# Patient Record
Sex: Female | Born: 1935 | Race: White | Hispanic: No | Marital: Married | Smoking: Never smoker
Health system: Southern US, Community
[De-identification: ages and names within clinical notes are randomized; demographics above are authoritative.]

## PROBLEM LIST (undated history)

## (undated) DIAGNOSIS — E119 Type 2 diabetes mellitus without complications: Secondary | ICD-10-CM

## (undated) DIAGNOSIS — Z8679 Personal history of other diseases of the circulatory system: Secondary | ICD-10-CM

## (undated) HISTORY — PX: JOINT REPLACEMENT: SHX530

## (undated) HISTORY — PX: ROTATOR CUFF REPAIR: SHX139

## (undated) HISTORY — PX: ANGIOPLASTY: SHX39

---

## 2011-05-19 DIAGNOSIS — IMO0002 Reserved for concepts with insufficient information to code with codable children: Secondary | ICD-10-CM | POA: Diagnosis not present

## 2011-05-21 DIAGNOSIS — M25569 Pain in unspecified knee: Secondary | ICD-10-CM | POA: Diagnosis not present

## 2011-05-26 DIAGNOSIS — B373 Candidiasis of vulva and vagina: Secondary | ICD-10-CM | POA: Diagnosis not present

## 2011-05-28 DIAGNOSIS — M25569 Pain in unspecified knee: Secondary | ICD-10-CM | POA: Diagnosis not present

## 2011-05-28 DIAGNOSIS — E2839 Other primary ovarian failure: Secondary | ICD-10-CM | POA: Diagnosis not present

## 2011-05-28 DIAGNOSIS — M25559 Pain in unspecified hip: Secondary | ICD-10-CM | POA: Diagnosis not present

## 2011-06-03 DIAGNOSIS — M25569 Pain in unspecified knee: Secondary | ICD-10-CM | POA: Diagnosis not present

## 2011-06-16 DIAGNOSIS — E119 Type 2 diabetes mellitus without complications: Secondary | ICD-10-CM | POA: Diagnosis not present

## 2011-06-16 DIAGNOSIS — I10 Essential (primary) hypertension: Secondary | ICD-10-CM | POA: Diagnosis not present

## 2011-06-16 DIAGNOSIS — E782 Mixed hyperlipidemia: Secondary | ICD-10-CM | POA: Diagnosis not present

## 2011-06-19 DIAGNOSIS — E119 Type 2 diabetes mellitus without complications: Secondary | ICD-10-CM | POA: Diagnosis not present

## 2011-06-19 DIAGNOSIS — E782 Mixed hyperlipidemia: Secondary | ICD-10-CM | POA: Diagnosis not present

## 2011-07-10 DIAGNOSIS — E7800 Pure hypercholesterolemia, unspecified: Secondary | ICD-10-CM | POA: Diagnosis not present

## 2011-07-10 DIAGNOSIS — E119 Type 2 diabetes mellitus without complications: Secondary | ICD-10-CM | POA: Diagnosis not present

## 2011-07-10 DIAGNOSIS — Z79899 Other long term (current) drug therapy: Secondary | ICD-10-CM | POA: Diagnosis not present

## 2011-07-10 DIAGNOSIS — I2510 Atherosclerotic heart disease of native coronary artery without angina pectoris: Secondary | ICD-10-CM | POA: Diagnosis not present

## 2011-07-24 DIAGNOSIS — I2510 Atherosclerotic heart disease of native coronary artery without angina pectoris: Secondary | ICD-10-CM | POA: Diagnosis not present

## 2011-07-31 DIAGNOSIS — I2510 Atherosclerotic heart disease of native coronary artery without angina pectoris: Secondary | ICD-10-CM | POA: Diagnosis not present

## 2011-08-26 DIAGNOSIS — M25569 Pain in unspecified knee: Secondary | ICD-10-CM | POA: Diagnosis not present

## 2011-08-26 DIAGNOSIS — M79609 Pain in unspecified limb: Secondary | ICD-10-CM | POA: Diagnosis not present

## 2011-08-26 DIAGNOSIS — Z01818 Encounter for other preprocedural examination: Secondary | ICD-10-CM | POA: Diagnosis not present

## 2011-08-26 DIAGNOSIS — Z0389 Encounter for observation for other suspected diseases and conditions ruled out: Secondary | ICD-10-CM | POA: Diagnosis not present

## 2011-09-22 DIAGNOSIS — Z7982 Long term (current) use of aspirin: Secondary | ICD-10-CM | POA: Diagnosis not present

## 2011-09-22 DIAGNOSIS — H409 Unspecified glaucoma: Secondary | ICD-10-CM | POA: Diagnosis present

## 2011-09-22 DIAGNOSIS — Z794 Long term (current) use of insulin: Secondary | ICD-10-CM | POA: Diagnosis not present

## 2011-09-22 DIAGNOSIS — Z9861 Coronary angioplasty status: Secondary | ICD-10-CM | POA: Diagnosis not present

## 2011-09-22 DIAGNOSIS — M25569 Pain in unspecified knee: Secondary | ICD-10-CM | POA: Diagnosis not present

## 2011-09-22 DIAGNOSIS — Z87891 Personal history of nicotine dependence: Secondary | ICD-10-CM | POA: Diagnosis not present

## 2011-09-22 DIAGNOSIS — E785 Hyperlipidemia, unspecified: Secondary | ICD-10-CM | POA: Diagnosis present

## 2011-09-22 DIAGNOSIS — E119 Type 2 diabetes mellitus without complications: Secondary | ICD-10-CM | POA: Diagnosis present

## 2011-09-22 DIAGNOSIS — M1710 Unilateral primary osteoarthritis, unspecified knee: Secondary | ICD-10-CM | POA: Diagnosis not present

## 2011-09-22 DIAGNOSIS — G8918 Other acute postprocedural pain: Secondary | ICD-10-CM | POA: Diagnosis not present

## 2011-09-22 DIAGNOSIS — IMO0002 Reserved for concepts with insufficient information to code with codable children: Secondary | ICD-10-CM | POA: Diagnosis not present

## 2011-09-22 DIAGNOSIS — Z6830 Body mass index (BMI) 30.0-30.9, adult: Secondary | ICD-10-CM | POA: Diagnosis not present

## 2011-09-22 DIAGNOSIS — E669 Obesity, unspecified: Secondary | ICD-10-CM | POA: Diagnosis present

## 2011-09-22 DIAGNOSIS — I10 Essential (primary) hypertension: Secondary | ICD-10-CM | POA: Diagnosis present

## 2011-09-22 DIAGNOSIS — I519 Heart disease, unspecified: Secondary | ICD-10-CM | POA: Diagnosis present

## 2011-09-26 DIAGNOSIS — Z7901 Long term (current) use of anticoagulants: Secondary | ICD-10-CM | POA: Diagnosis not present

## 2011-09-26 DIAGNOSIS — IMO0001 Reserved for inherently not codable concepts without codable children: Secondary | ICD-10-CM | POA: Diagnosis not present

## 2011-09-26 DIAGNOSIS — Z96659 Presence of unspecified artificial knee joint: Secondary | ICD-10-CM | POA: Diagnosis not present

## 2011-09-29 DIAGNOSIS — Z7901 Long term (current) use of anticoagulants: Secondary | ICD-10-CM | POA: Diagnosis not present

## 2011-09-29 DIAGNOSIS — Z96659 Presence of unspecified artificial knee joint: Secondary | ICD-10-CM | POA: Diagnosis not present

## 2011-09-29 DIAGNOSIS — IMO0001 Reserved for inherently not codable concepts without codable children: Secondary | ICD-10-CM | POA: Diagnosis not present

## 2011-09-30 DIAGNOSIS — Z7901 Long term (current) use of anticoagulants: Secondary | ICD-10-CM | POA: Diagnosis not present

## 2011-09-30 DIAGNOSIS — IMO0001 Reserved for inherently not codable concepts without codable children: Secondary | ICD-10-CM | POA: Diagnosis not present

## 2011-09-30 DIAGNOSIS — Z96659 Presence of unspecified artificial knee joint: Secondary | ICD-10-CM | POA: Diagnosis not present

## 2011-10-01 DIAGNOSIS — IMO0001 Reserved for inherently not codable concepts without codable children: Secondary | ICD-10-CM | POA: Diagnosis not present

## 2011-10-01 DIAGNOSIS — Z96659 Presence of unspecified artificial knee joint: Secondary | ICD-10-CM | POA: Diagnosis not present

## 2011-10-01 DIAGNOSIS — Z7901 Long term (current) use of anticoagulants: Secondary | ICD-10-CM | POA: Diagnosis not present

## 2011-10-02 DIAGNOSIS — IMO0001 Reserved for inherently not codable concepts without codable children: Secondary | ICD-10-CM | POA: Diagnosis not present

## 2011-10-02 DIAGNOSIS — Z96659 Presence of unspecified artificial knee joint: Secondary | ICD-10-CM | POA: Diagnosis not present

## 2011-10-02 DIAGNOSIS — Z7901 Long term (current) use of anticoagulants: Secondary | ICD-10-CM | POA: Diagnosis not present

## 2011-10-03 DIAGNOSIS — IMO0001 Reserved for inherently not codable concepts without codable children: Secondary | ICD-10-CM | POA: Diagnosis not present

## 2011-10-03 DIAGNOSIS — Z96659 Presence of unspecified artificial knee joint: Secondary | ICD-10-CM | POA: Diagnosis not present

## 2011-10-03 DIAGNOSIS — Z7901 Long term (current) use of anticoagulants: Secondary | ICD-10-CM | POA: Diagnosis not present

## 2011-10-06 DIAGNOSIS — IMO0001 Reserved for inherently not codable concepts without codable children: Secondary | ICD-10-CM | POA: Diagnosis not present

## 2011-10-06 DIAGNOSIS — Z7901 Long term (current) use of anticoagulants: Secondary | ICD-10-CM | POA: Diagnosis not present

## 2011-10-06 DIAGNOSIS — Z96659 Presence of unspecified artificial knee joint: Secondary | ICD-10-CM | POA: Diagnosis not present

## 2011-10-07 DIAGNOSIS — M25569 Pain in unspecified knee: Secondary | ICD-10-CM | POA: Diagnosis not present

## 2011-10-09 DIAGNOSIS — Z96659 Presence of unspecified artificial knee joint: Secondary | ICD-10-CM | POA: Diagnosis not present

## 2011-10-09 DIAGNOSIS — IMO0001 Reserved for inherently not codable concepts without codable children: Secondary | ICD-10-CM | POA: Diagnosis not present

## 2011-10-09 DIAGNOSIS — Z7901 Long term (current) use of anticoagulants: Secondary | ICD-10-CM | POA: Diagnosis not present

## 2011-10-10 DIAGNOSIS — IMO0001 Reserved for inherently not codable concepts without codable children: Secondary | ICD-10-CM | POA: Diagnosis not present

## 2011-10-10 DIAGNOSIS — Z96659 Presence of unspecified artificial knee joint: Secondary | ICD-10-CM | POA: Diagnosis not present

## 2011-10-10 DIAGNOSIS — Z7901 Long term (current) use of anticoagulants: Secondary | ICD-10-CM | POA: Diagnosis not present

## 2011-10-13 DIAGNOSIS — Z96659 Presence of unspecified artificial knee joint: Secondary | ICD-10-CM | POA: Diagnosis not present

## 2011-10-13 DIAGNOSIS — IMO0001 Reserved for inherently not codable concepts without codable children: Secondary | ICD-10-CM | POA: Diagnosis not present

## 2011-10-13 DIAGNOSIS — Z7901 Long term (current) use of anticoagulants: Secondary | ICD-10-CM | POA: Diagnosis not present

## 2011-10-14 DIAGNOSIS — Z7901 Long term (current) use of anticoagulants: Secondary | ICD-10-CM | POA: Diagnosis not present

## 2011-10-14 DIAGNOSIS — Z96659 Presence of unspecified artificial knee joint: Secondary | ICD-10-CM | POA: Diagnosis not present

## 2011-10-14 DIAGNOSIS — IMO0001 Reserved for inherently not codable concepts without codable children: Secondary | ICD-10-CM | POA: Diagnosis not present

## 2011-10-16 DIAGNOSIS — I10 Essential (primary) hypertension: Secondary | ICD-10-CM | POA: Diagnosis not present

## 2011-10-16 DIAGNOSIS — E782 Mixed hyperlipidemia: Secondary | ICD-10-CM | POA: Diagnosis not present

## 2011-10-16 DIAGNOSIS — E119 Type 2 diabetes mellitus without complications: Secondary | ICD-10-CM | POA: Diagnosis not present

## 2011-10-21 DIAGNOSIS — Z96659 Presence of unspecified artificial knee joint: Secondary | ICD-10-CM | POA: Diagnosis not present

## 2011-10-21 DIAGNOSIS — IMO0001 Reserved for inherently not codable concepts without codable children: Secondary | ICD-10-CM | POA: Diagnosis not present

## 2011-10-21 DIAGNOSIS — Z7901 Long term (current) use of anticoagulants: Secondary | ICD-10-CM | POA: Diagnosis not present

## 2011-10-24 DIAGNOSIS — Z96659 Presence of unspecified artificial knee joint: Secondary | ICD-10-CM | POA: Diagnosis not present

## 2011-10-24 DIAGNOSIS — IMO0001 Reserved for inherently not codable concepts without codable children: Secondary | ICD-10-CM | POA: Diagnosis not present

## 2011-10-24 DIAGNOSIS — Z7901 Long term (current) use of anticoagulants: Secondary | ICD-10-CM | POA: Diagnosis not present

## 2011-10-27 DIAGNOSIS — Z96659 Presence of unspecified artificial knee joint: Secondary | ICD-10-CM | POA: Diagnosis not present

## 2011-10-27 DIAGNOSIS — Z7901 Long term (current) use of anticoagulants: Secondary | ICD-10-CM | POA: Diagnosis not present

## 2011-10-27 DIAGNOSIS — IMO0001 Reserved for inherently not codable concepts without codable children: Secondary | ICD-10-CM | POA: Diagnosis not present

## 2011-11-03 DIAGNOSIS — Z96659 Presence of unspecified artificial knee joint: Secondary | ICD-10-CM | POA: Diagnosis not present

## 2011-11-03 DIAGNOSIS — IMO0001 Reserved for inherently not codable concepts without codable children: Secondary | ICD-10-CM | POA: Diagnosis not present

## 2011-11-03 DIAGNOSIS — Z7901 Long term (current) use of anticoagulants: Secondary | ICD-10-CM | POA: Diagnosis not present

## 2011-11-11 DIAGNOSIS — M25569 Pain in unspecified knee: Secondary | ICD-10-CM | POA: Diagnosis not present

## 2011-11-13 DIAGNOSIS — Z96659 Presence of unspecified artificial knee joint: Secondary | ICD-10-CM | POA: Diagnosis not present

## 2011-11-13 DIAGNOSIS — Z7901 Long term (current) use of anticoagulants: Secondary | ICD-10-CM | POA: Diagnosis not present

## 2011-11-13 DIAGNOSIS — IMO0001 Reserved for inherently not codable concepts without codable children: Secondary | ICD-10-CM | POA: Diagnosis not present

## 2011-11-17 DIAGNOSIS — IMO0001 Reserved for inherently not codable concepts without codable children: Secondary | ICD-10-CM | POA: Diagnosis not present

## 2011-11-17 DIAGNOSIS — Z7901 Long term (current) use of anticoagulants: Secondary | ICD-10-CM | POA: Diagnosis not present

## 2011-11-17 DIAGNOSIS — Z96659 Presence of unspecified artificial knee joint: Secondary | ICD-10-CM | POA: Diagnosis not present

## 2011-11-21 DIAGNOSIS — IMO0001 Reserved for inherently not codable concepts without codable children: Secondary | ICD-10-CM | POA: Diagnosis not present

## 2011-11-21 DIAGNOSIS — Z7901 Long term (current) use of anticoagulants: Secondary | ICD-10-CM | POA: Diagnosis not present

## 2011-11-21 DIAGNOSIS — Z96659 Presence of unspecified artificial knee joint: Secondary | ICD-10-CM | POA: Diagnosis not present

## 2012-01-16 DIAGNOSIS — I10 Essential (primary) hypertension: Secondary | ICD-10-CM | POA: Diagnosis not present

## 2012-02-17 DIAGNOSIS — R05 Cough: Secondary | ICD-10-CM | POA: Diagnosis not present

## 2012-02-17 DIAGNOSIS — R0982 Postnasal drip: Secondary | ICD-10-CM | POA: Diagnosis not present

## 2012-03-30 DIAGNOSIS — Z471 Aftercare following joint replacement surgery: Secondary | ICD-10-CM | POA: Diagnosis not present

## 2012-03-30 DIAGNOSIS — Z96659 Presence of unspecified artificial knee joint: Secondary | ICD-10-CM | POA: Diagnosis not present

## 2012-04-20 DIAGNOSIS — I10 Essential (primary) hypertension: Secondary | ICD-10-CM | POA: Diagnosis not present

## 2012-05-15 DIAGNOSIS — J019 Acute sinusitis, unspecified: Secondary | ICD-10-CM | POA: Diagnosis not present

## 2012-05-15 DIAGNOSIS — R509 Fever, unspecified: Secondary | ICD-10-CM | POA: Diagnosis not present

## 2012-05-15 DIAGNOSIS — R05 Cough: Secondary | ICD-10-CM | POA: Diagnosis not present

## 2012-07-19 DIAGNOSIS — I10 Essential (primary) hypertension: Secondary | ICD-10-CM | POA: Diagnosis not present

## 2012-07-19 DIAGNOSIS — E782 Mixed hyperlipidemia: Secondary | ICD-10-CM | POA: Diagnosis not present

## 2012-07-21 DIAGNOSIS — B373 Candidiasis of vulva and vagina: Secondary | ICD-10-CM | POA: Diagnosis not present

## 2012-07-21 DIAGNOSIS — N760 Acute vaginitis: Secondary | ICD-10-CM | POA: Diagnosis not present

## 2012-10-05 DIAGNOSIS — Z96659 Presence of unspecified artificial knee joint: Secondary | ICD-10-CM | POA: Diagnosis not present

## 2012-10-05 DIAGNOSIS — Z471 Aftercare following joint replacement surgery: Secondary | ICD-10-CM | POA: Diagnosis not present

## 2012-10-18 DIAGNOSIS — E119 Type 2 diabetes mellitus without complications: Secondary | ICD-10-CM | POA: Diagnosis not present

## 2012-11-19 DIAGNOSIS — Z01419 Encounter for gynecological examination (general) (routine) without abnormal findings: Secondary | ICD-10-CM | POA: Diagnosis not present

## 2012-11-19 DIAGNOSIS — Z124 Encounter for screening for malignant neoplasm of cervix: Secondary | ICD-10-CM | POA: Diagnosis not present

## 2012-11-19 DIAGNOSIS — Z1231 Encounter for screening mammogram for malignant neoplasm of breast: Secondary | ICD-10-CM | POA: Diagnosis not present

## 2012-12-24 DIAGNOSIS — I10 Essential (primary) hypertension: Secondary | ICD-10-CM | POA: Diagnosis not present

## 2012-12-24 DIAGNOSIS — E119 Type 2 diabetes mellitus without complications: Secondary | ICD-10-CM | POA: Diagnosis not present

## 2013-01-26 DIAGNOSIS — I259 Chronic ischemic heart disease, unspecified: Secondary | ICD-10-CM | POA: Diagnosis not present

## 2013-01-26 DIAGNOSIS — I2510 Atherosclerotic heart disease of native coronary artery without angina pectoris: Secondary | ICD-10-CM | POA: Diagnosis not present

## 2013-03-02 DIAGNOSIS — R05 Cough: Secondary | ICD-10-CM | POA: Diagnosis not present

## 2013-03-02 DIAGNOSIS — J019 Acute sinusitis, unspecified: Secondary | ICD-10-CM | POA: Diagnosis not present

## 2013-05-23 DIAGNOSIS — Z0000 Encounter for general adult medical examination without abnormal findings: Secondary | ICD-10-CM | POA: Diagnosis not present

## 2013-05-23 DIAGNOSIS — IMO0001 Reserved for inherently not codable concepts without codable children: Secondary | ICD-10-CM | POA: Diagnosis not present

## 2013-05-23 DIAGNOSIS — I10 Essential (primary) hypertension: Secondary | ICD-10-CM | POA: Diagnosis not present

## 2013-07-05 DIAGNOSIS — Z79899 Other long term (current) drug therapy: Secondary | ICD-10-CM | POA: Diagnosis not present

## 2013-07-05 DIAGNOSIS — F411 Generalized anxiety disorder: Secondary | ICD-10-CM | POA: Diagnosis not present

## 2013-07-05 DIAGNOSIS — Z9861 Coronary angioplasty status: Secondary | ICD-10-CM | POA: Diagnosis not present

## 2013-07-05 DIAGNOSIS — I10 Essential (primary) hypertension: Secondary | ICD-10-CM | POA: Diagnosis not present

## 2013-07-05 DIAGNOSIS — K5730 Diverticulosis of large intestine without perforation or abscess without bleeding: Secondary | ICD-10-CM | POA: Diagnosis not present

## 2013-07-05 DIAGNOSIS — Z794 Long term (current) use of insulin: Secondary | ICD-10-CM | POA: Diagnosis not present

## 2013-07-05 DIAGNOSIS — Z96659 Presence of unspecified artificial knee joint: Secondary | ICD-10-CM | POA: Diagnosis not present

## 2013-07-05 DIAGNOSIS — D126 Benign neoplasm of colon, unspecified: Secondary | ICD-10-CM | POA: Diagnosis not present

## 2013-07-05 DIAGNOSIS — I2510 Atherosclerotic heart disease of native coronary artery without angina pectoris: Secondary | ICD-10-CM | POA: Diagnosis not present

## 2013-07-05 DIAGNOSIS — E119 Type 2 diabetes mellitus without complications: Secondary | ICD-10-CM | POA: Diagnosis not present

## 2013-07-05 DIAGNOSIS — Z7982 Long term (current) use of aspirin: Secondary | ICD-10-CM | POA: Diagnosis not present

## 2013-07-05 DIAGNOSIS — Z1211 Encounter for screening for malignant neoplasm of colon: Secondary | ICD-10-CM | POA: Diagnosis not present

## 2013-07-05 DIAGNOSIS — D129 Benign neoplasm of anus and anal canal: Secondary | ICD-10-CM | POA: Diagnosis not present

## 2013-07-05 DIAGNOSIS — D369 Benign neoplasm, unspecified site: Secondary | ICD-10-CM | POA: Diagnosis not present

## 2013-07-05 DIAGNOSIS — Z8489 Family history of other specified conditions: Secondary | ICD-10-CM | POA: Diagnosis not present

## 2013-07-05 DIAGNOSIS — Z87891 Personal history of nicotine dependence: Secondary | ICD-10-CM | POA: Diagnosis not present

## 2013-07-05 DIAGNOSIS — E785 Hyperlipidemia, unspecified: Secondary | ICD-10-CM | POA: Diagnosis not present

## 2013-07-05 DIAGNOSIS — Z885 Allergy status to narcotic agent status: Secondary | ICD-10-CM | POA: Diagnosis not present

## 2013-07-05 DIAGNOSIS — D128 Benign neoplasm of rectum: Secondary | ICD-10-CM | POA: Diagnosis not present

## 2013-07-21 DIAGNOSIS — R5381 Other malaise: Secondary | ICD-10-CM | POA: Diagnosis not present

## 2013-07-21 DIAGNOSIS — B9789 Other viral agents as the cause of diseases classified elsewhere: Secondary | ICD-10-CM | POA: Diagnosis not present

## 2013-07-21 DIAGNOSIS — R5383 Other fatigue: Secondary | ICD-10-CM | POA: Diagnosis not present

## 2013-07-21 DIAGNOSIS — IMO0001 Reserved for inherently not codable concepts without codable children: Secondary | ICD-10-CM | POA: Diagnosis not present

## 2013-10-04 DIAGNOSIS — M25569 Pain in unspecified knee: Secondary | ICD-10-CM | POA: Diagnosis not present

## 2013-10-04 DIAGNOSIS — Z96659 Presence of unspecified artificial knee joint: Secondary | ICD-10-CM | POA: Diagnosis not present

## 2013-10-04 DIAGNOSIS — Z471 Aftercare following joint replacement surgery: Secondary | ICD-10-CM | POA: Diagnosis not present

## 2013-10-09 DIAGNOSIS — Z794 Long term (current) use of insulin: Secondary | ICD-10-CM | POA: Diagnosis not present

## 2013-10-09 DIAGNOSIS — E1169 Type 2 diabetes mellitus with other specified complication: Secondary | ICD-10-CM | POA: Diagnosis not present

## 2013-10-09 DIAGNOSIS — R5381 Other malaise: Secondary | ICD-10-CM | POA: Diagnosis not present

## 2013-10-09 DIAGNOSIS — Z885 Allergy status to narcotic agent status: Secondary | ICD-10-CM | POA: Diagnosis not present

## 2013-10-09 DIAGNOSIS — R918 Other nonspecific abnormal finding of lung field: Secondary | ICD-10-CM | POA: Diagnosis not present

## 2013-10-09 DIAGNOSIS — E860 Dehydration: Secondary | ICD-10-CM | POA: Diagnosis not present

## 2013-10-09 DIAGNOSIS — Z888 Allergy status to other drugs, medicaments and biological substances status: Secondary | ICD-10-CM | POA: Diagnosis not present

## 2013-10-09 DIAGNOSIS — E871 Hypo-osmolality and hyponatremia: Secondary | ICD-10-CM | POA: Diagnosis not present

## 2013-10-09 DIAGNOSIS — Z87891 Personal history of nicotine dependence: Secondary | ICD-10-CM | POA: Diagnosis not present

## 2013-10-09 DIAGNOSIS — I2510 Atherosclerotic heart disease of native coronary artery without angina pectoris: Secondary | ICD-10-CM | POA: Diagnosis present

## 2013-10-09 DIAGNOSIS — Z9861 Coronary angioplasty status: Secondary | ICD-10-CM | POA: Diagnosis not present

## 2013-10-09 DIAGNOSIS — E1310 Other specified diabetes mellitus with ketoacidosis without coma: Secondary | ICD-10-CM | POA: Diagnosis not present

## 2013-10-09 DIAGNOSIS — E785 Hyperlipidemia, unspecified: Secondary | ICD-10-CM | POA: Diagnosis present

## 2013-10-09 DIAGNOSIS — N390 Urinary tract infection, site not specified: Secondary | ICD-10-CM | POA: Diagnosis not present

## 2013-10-09 DIAGNOSIS — Z79899 Other long term (current) drug therapy: Secondary | ICD-10-CM | POA: Diagnosis not present

## 2013-10-09 DIAGNOSIS — Z7982 Long term (current) use of aspirin: Secondary | ICD-10-CM | POA: Diagnosis not present

## 2013-10-09 DIAGNOSIS — I10 Essential (primary) hypertension: Secondary | ICD-10-CM | POA: Diagnosis present

## 2013-10-09 DIAGNOSIS — R5383 Other fatigue: Secondary | ICD-10-CM | POA: Diagnosis not present

## 2013-10-09 DIAGNOSIS — N179 Acute kidney failure, unspecified: Secondary | ICD-10-CM | POA: Diagnosis not present

## 2013-10-09 DIAGNOSIS — Z780 Asymptomatic menopausal state: Secondary | ICD-10-CM | POA: Diagnosis not present

## 2013-10-26 DIAGNOSIS — M25569 Pain in unspecified knee: Secondary | ICD-10-CM | POA: Diagnosis not present

## 2013-12-15 DIAGNOSIS — I10 Essential (primary) hypertension: Secondary | ICD-10-CM | POA: Diagnosis not present

## 2013-12-15 DIAGNOSIS — IMO0001 Reserved for inherently not codable concepts without codable children: Secondary | ICD-10-CM | POA: Diagnosis not present

## 2013-12-28 DIAGNOSIS — E119 Type 2 diabetes mellitus without complications: Secondary | ICD-10-CM | POA: Diagnosis not present

## 2013-12-28 DIAGNOSIS — E782 Mixed hyperlipidemia: Secondary | ICD-10-CM | POA: Diagnosis not present

## 2014-01-19 DIAGNOSIS — I2510 Atherosclerotic heart disease of native coronary artery without angina pectoris: Secondary | ICD-10-CM | POA: Diagnosis not present

## 2014-03-27 DIAGNOSIS — I10 Essential (primary) hypertension: Secondary | ICD-10-CM | POA: Diagnosis not present

## 2014-03-27 DIAGNOSIS — E1165 Type 2 diabetes mellitus with hyperglycemia: Secondary | ICD-10-CM | POA: Diagnosis not present

## 2014-03-31 DIAGNOSIS — J019 Acute sinusitis, unspecified: Secondary | ICD-10-CM | POA: Diagnosis not present

## 2014-03-31 DIAGNOSIS — E8881 Metabolic syndrome: Secondary | ICD-10-CM | POA: Diagnosis not present

## 2014-03-31 DIAGNOSIS — R05 Cough: Secondary | ICD-10-CM | POA: Diagnosis not present

## 2014-07-06 DIAGNOSIS — I10 Essential (primary) hypertension: Secondary | ICD-10-CM | POA: Diagnosis not present

## 2014-07-06 DIAGNOSIS — E1165 Type 2 diabetes mellitus with hyperglycemia: Secondary | ICD-10-CM | POA: Diagnosis not present

## 2014-07-19 DIAGNOSIS — I10 Essential (primary) hypertension: Secondary | ICD-10-CM | POA: Diagnosis not present

## 2014-07-19 DIAGNOSIS — E1165 Type 2 diabetes mellitus with hyperglycemia: Secondary | ICD-10-CM | POA: Diagnosis not present

## 2014-09-27 DIAGNOSIS — Z96651 Presence of right artificial knee joint: Secondary | ICD-10-CM | POA: Diagnosis not present

## 2014-09-27 DIAGNOSIS — Z471 Aftercare following joint replacement surgery: Secondary | ICD-10-CM | POA: Diagnosis not present

## 2014-10-06 DIAGNOSIS — I10 Essential (primary) hypertension: Secondary | ICD-10-CM | POA: Diagnosis not present

## 2014-10-06 DIAGNOSIS — R5383 Other fatigue: Secondary | ICD-10-CM | POA: Diagnosis not present

## 2014-10-06 DIAGNOSIS — Z0000 Encounter for general adult medical examination without abnormal findings: Secondary | ICD-10-CM | POA: Diagnosis not present

## 2014-10-06 DIAGNOSIS — Z1389 Encounter for screening for other disorder: Secondary | ICD-10-CM | POA: Diagnosis not present

## 2014-10-06 DIAGNOSIS — E1165 Type 2 diabetes mellitus with hyperglycemia: Secondary | ICD-10-CM | POA: Diagnosis not present

## 2014-11-13 DIAGNOSIS — J019 Acute sinusitis, unspecified: Secondary | ICD-10-CM | POA: Diagnosis not present

## 2014-11-13 DIAGNOSIS — E8881 Metabolic syndrome: Secondary | ICD-10-CM | POA: Diagnosis not present

## 2015-01-05 DIAGNOSIS — R5383 Other fatigue: Secondary | ICD-10-CM | POA: Diagnosis not present

## 2015-01-05 DIAGNOSIS — E1165 Type 2 diabetes mellitus with hyperglycemia: Secondary | ICD-10-CM | POA: Diagnosis not present

## 2015-01-05 DIAGNOSIS — I10 Essential (primary) hypertension: Secondary | ICD-10-CM | POA: Diagnosis not present

## 2015-03-01 DIAGNOSIS — I2510 Atherosclerotic heart disease of native coronary artery without angina pectoris: Secondary | ICD-10-CM | POA: Diagnosis not present

## 2015-04-05 DIAGNOSIS — E1121 Type 2 diabetes mellitus with diabetic nephropathy: Secondary | ICD-10-CM | POA: Diagnosis not present

## 2015-04-05 DIAGNOSIS — I2510 Atherosclerotic heart disease of native coronary artery without angina pectoris: Secondary | ICD-10-CM | POA: Diagnosis not present

## 2015-04-05 DIAGNOSIS — I10 Essential (primary) hypertension: Secondary | ICD-10-CM | POA: Diagnosis not present

## 2015-07-05 DIAGNOSIS — I2510 Atherosclerotic heart disease of native coronary artery without angina pectoris: Secondary | ICD-10-CM | POA: Diagnosis not present

## 2015-07-05 DIAGNOSIS — I10 Essential (primary) hypertension: Secondary | ICD-10-CM | POA: Diagnosis not present

## 2015-07-05 DIAGNOSIS — E1121 Type 2 diabetes mellitus with diabetic nephropathy: Secondary | ICD-10-CM | POA: Diagnosis not present

## 2015-08-29 DIAGNOSIS — I2510 Atherosclerotic heart disease of native coronary artery without angina pectoris: Secondary | ICD-10-CM | POA: Diagnosis not present

## 2015-09-10 DIAGNOSIS — E119 Type 2 diabetes mellitus without complications: Secondary | ICD-10-CM | POA: Diagnosis not present

## 2015-09-10 DIAGNOSIS — E785 Hyperlipidemia, unspecified: Secondary | ICD-10-CM | POA: Diagnosis not present

## 2015-09-10 DIAGNOSIS — I10 Essential (primary) hypertension: Secondary | ICD-10-CM | POA: Diagnosis not present

## 2015-09-10 DIAGNOSIS — I2510 Atherosclerotic heart disease of native coronary artery without angina pectoris: Secondary | ICD-10-CM | POA: Diagnosis not present

## 2015-09-27 DIAGNOSIS — Z96651 Presence of right artificial knee joint: Secondary | ICD-10-CM | POA: Diagnosis not present

## 2015-09-27 DIAGNOSIS — Z471 Aftercare following joint replacement surgery: Secondary | ICD-10-CM | POA: Diagnosis not present

## 2015-10-11 DIAGNOSIS — Z1389 Encounter for screening for other disorder: Secondary | ICD-10-CM | POA: Diagnosis not present

## 2015-10-11 DIAGNOSIS — E1122 Type 2 diabetes mellitus with diabetic chronic kidney disease: Secondary | ICD-10-CM | POA: Diagnosis not present

## 2015-10-11 DIAGNOSIS — E784 Other hyperlipidemia: Secondary | ICD-10-CM | POA: Diagnosis not present

## 2015-10-11 DIAGNOSIS — I10 Essential (primary) hypertension: Secondary | ICD-10-CM | POA: Diagnosis not present

## 2015-10-11 DIAGNOSIS — Z23 Encounter for immunization: Secondary | ICD-10-CM | POA: Diagnosis not present

## 2015-10-11 DIAGNOSIS — Z0000 Encounter for general adult medical examination without abnormal findings: Secondary | ICD-10-CM | POA: Diagnosis not present

## 2015-10-25 DIAGNOSIS — Z23 Encounter for immunization: Secondary | ICD-10-CM | POA: Diagnosis not present

## 2015-10-30 DIAGNOSIS — Z1231 Encounter for screening mammogram for malignant neoplasm of breast: Secondary | ICD-10-CM | POA: Diagnosis not present

## 2015-11-20 DIAGNOSIS — E119 Type 2 diabetes mellitus without complications: Secondary | ICD-10-CM | POA: Diagnosis not present

## 2015-11-20 DIAGNOSIS — H538 Other visual disturbances: Secondary | ICD-10-CM | POA: Diagnosis not present

## 2015-11-20 DIAGNOSIS — H2511 Age-related nuclear cataract, right eye: Secondary | ICD-10-CM | POA: Diagnosis not present

## 2015-11-23 DIAGNOSIS — E1122 Type 2 diabetes mellitus with diabetic chronic kidney disease: Secondary | ICD-10-CM | POA: Diagnosis not present

## 2015-11-23 DIAGNOSIS — I10 Essential (primary) hypertension: Secondary | ICD-10-CM | POA: Diagnosis not present

## 2015-11-23 DIAGNOSIS — E784 Other hyperlipidemia: Secondary | ICD-10-CM | POA: Diagnosis not present

## 2016-01-08 DIAGNOSIS — I10 Essential (primary) hypertension: Secondary | ICD-10-CM | POA: Diagnosis not present

## 2016-01-08 DIAGNOSIS — H269 Unspecified cataract: Secondary | ICD-10-CM | POA: Diagnosis not present

## 2016-01-08 DIAGNOSIS — F419 Anxiety disorder, unspecified: Secondary | ICD-10-CM | POA: Diagnosis not present

## 2016-01-08 DIAGNOSIS — H5210 Myopia, unspecified eye: Secondary | ICD-10-CM | POA: Diagnosis not present

## 2016-01-08 DIAGNOSIS — E1136 Type 2 diabetes mellitus with diabetic cataract: Secondary | ICD-10-CM | POA: Diagnosis not present

## 2016-01-08 DIAGNOSIS — Z794 Long term (current) use of insulin: Secondary | ICD-10-CM | POA: Diagnosis not present

## 2016-01-08 DIAGNOSIS — H524 Presbyopia: Secondary | ICD-10-CM | POA: Diagnosis not present

## 2016-01-08 DIAGNOSIS — M1990 Unspecified osteoarthritis, unspecified site: Secondary | ICD-10-CM | POA: Diagnosis not present

## 2016-01-08 DIAGNOSIS — E119 Type 2 diabetes mellitus without complications: Secondary | ICD-10-CM | POA: Diagnosis not present

## 2016-01-08 DIAGNOSIS — I2510 Atherosclerotic heart disease of native coronary artery without angina pectoris: Secondary | ICD-10-CM | POA: Diagnosis not present

## 2016-01-08 DIAGNOSIS — H2511 Age-related nuclear cataract, right eye: Secondary | ICD-10-CM | POA: Diagnosis not present

## 2016-01-08 DIAGNOSIS — H52209 Unspecified astigmatism, unspecified eye: Secondary | ICD-10-CM | POA: Diagnosis not present

## 2016-01-08 DIAGNOSIS — Z886 Allergy status to analgesic agent status: Secondary | ICD-10-CM | POA: Diagnosis not present

## 2016-01-08 DIAGNOSIS — H2513 Age-related nuclear cataract, bilateral: Secondary | ICD-10-CM | POA: Diagnosis not present

## 2016-01-08 DIAGNOSIS — E785 Hyperlipidemia, unspecified: Secondary | ICD-10-CM | POA: Diagnosis not present

## 2016-01-08 DIAGNOSIS — Z884 Allergy status to anesthetic agent status: Secondary | ICD-10-CM | POA: Diagnosis not present

## 2016-01-08 DIAGNOSIS — Z96651 Presence of right artificial knee joint: Secondary | ICD-10-CM | POA: Diagnosis not present

## 2016-01-08 DIAGNOSIS — H25043 Posterior subcapsular polar age-related cataract, bilateral: Secondary | ICD-10-CM | POA: Diagnosis not present

## 2016-01-08 DIAGNOSIS — H538 Other visual disturbances: Secondary | ICD-10-CM | POA: Diagnosis not present

## 2016-01-11 DIAGNOSIS — I10 Essential (primary) hypertension: Secondary | ICD-10-CM | POA: Diagnosis not present

## 2016-01-11 DIAGNOSIS — E1122 Type 2 diabetes mellitus with diabetic chronic kidney disease: Secondary | ICD-10-CM | POA: Diagnosis not present

## 2016-01-11 DIAGNOSIS — E784 Other hyperlipidemia: Secondary | ICD-10-CM | POA: Diagnosis not present

## 2016-02-20 DIAGNOSIS — E1122 Type 2 diabetes mellitus with diabetic chronic kidney disease: Secondary | ICD-10-CM | POA: Diagnosis not present

## 2016-02-20 DIAGNOSIS — I10 Essential (primary) hypertension: Secondary | ICD-10-CM | POA: Diagnosis not present

## 2016-02-20 DIAGNOSIS — E784 Other hyperlipidemia: Secondary | ICD-10-CM | POA: Diagnosis not present

## 2016-04-09 DIAGNOSIS — R1012 Left upper quadrant pain: Secondary | ICD-10-CM | POA: Diagnosis not present

## 2016-04-09 DIAGNOSIS — J069 Acute upper respiratory infection, unspecified: Secondary | ICD-10-CM | POA: Diagnosis not present

## 2016-05-02 DIAGNOSIS — E1122 Type 2 diabetes mellitus with diabetic chronic kidney disease: Secondary | ICD-10-CM | POA: Diagnosis not present

## 2016-05-02 DIAGNOSIS — I10 Essential (primary) hypertension: Secondary | ICD-10-CM | POA: Diagnosis not present

## 2016-05-02 DIAGNOSIS — E784 Other hyperlipidemia: Secondary | ICD-10-CM | POA: Diagnosis not present

## 2016-06-18 DIAGNOSIS — I2510 Atherosclerotic heart disease of native coronary artery without angina pectoris: Secondary | ICD-10-CM | POA: Diagnosis not present

## 2016-06-18 DIAGNOSIS — I10 Essential (primary) hypertension: Secondary | ICD-10-CM | POA: Diagnosis not present

## 2016-06-18 DIAGNOSIS — E785 Hyperlipidemia, unspecified: Secondary | ICD-10-CM | POA: Diagnosis not present

## 2016-08-01 DIAGNOSIS — J3089 Other allergic rhinitis: Secondary | ICD-10-CM | POA: Diagnosis not present

## 2016-08-01 DIAGNOSIS — E1122 Type 2 diabetes mellitus with diabetic chronic kidney disease: Secondary | ICD-10-CM | POA: Diagnosis not present

## 2016-08-01 DIAGNOSIS — E784 Other hyperlipidemia: Secondary | ICD-10-CM | POA: Diagnosis not present

## 2016-08-01 DIAGNOSIS — I10 Essential (primary) hypertension: Secondary | ICD-10-CM | POA: Diagnosis not present

## 2016-11-04 DIAGNOSIS — E1122 Type 2 diabetes mellitus with diabetic chronic kidney disease: Secondary | ICD-10-CM | POA: Diagnosis not present

## 2016-11-04 DIAGNOSIS — E784 Other hyperlipidemia: Secondary | ICD-10-CM | POA: Diagnosis not present

## 2016-11-04 DIAGNOSIS — I10 Essential (primary) hypertension: Secondary | ICD-10-CM | POA: Diagnosis not present

## 2016-11-11 DIAGNOSIS — T481X5A Adverse effect of skeletal muscle relaxants [neuromuscular blocking agents], initial encounter: Secondary | ICD-10-CM | POA: Diagnosis not present

## 2016-11-11 DIAGNOSIS — I2510 Atherosclerotic heart disease of native coronary artery without angina pectoris: Secondary | ICD-10-CM | POA: Diagnosis not present

## 2016-11-11 DIAGNOSIS — Z7982 Long term (current) use of aspirin: Secondary | ICD-10-CM | POA: Diagnosis not present

## 2016-11-11 DIAGNOSIS — Z79899 Other long term (current) drug therapy: Secondary | ICD-10-CM | POA: Diagnosis not present

## 2016-11-11 DIAGNOSIS — F17200 Nicotine dependence, unspecified, uncomplicated: Secondary | ICD-10-CM | POA: Diagnosis not present

## 2016-11-11 DIAGNOSIS — R9431 Abnormal electrocardiogram [ECG] [EKG]: Secondary | ICD-10-CM | POA: Diagnosis not present

## 2016-11-11 DIAGNOSIS — N390 Urinary tract infection, site not specified: Secondary | ICD-10-CM | POA: Diagnosis not present

## 2016-11-11 DIAGNOSIS — Z794 Long term (current) use of insulin: Secondary | ICD-10-CM | POA: Diagnosis not present

## 2016-11-11 DIAGNOSIS — R319 Hematuria, unspecified: Secondary | ICD-10-CM | POA: Diagnosis not present

## 2016-11-11 DIAGNOSIS — I10 Essential (primary) hypertension: Secondary | ICD-10-CM | POA: Diagnosis not present

## 2016-11-11 DIAGNOSIS — R109 Unspecified abdominal pain: Secondary | ICD-10-CM | POA: Diagnosis not present

## 2016-11-11 DIAGNOSIS — R410 Disorientation, unspecified: Secondary | ICD-10-CM | POA: Diagnosis not present

## 2016-11-11 DIAGNOSIS — E119 Type 2 diabetes mellitus without complications: Secondary | ICD-10-CM | POA: Diagnosis not present

## 2016-11-11 DIAGNOSIS — T50905A Adverse effect of unspecified drugs, medicaments and biological substances, initial encounter: Secondary | ICD-10-CM | POA: Diagnosis not present

## 2016-11-19 DIAGNOSIS — N390 Urinary tract infection, site not specified: Secondary | ICD-10-CM | POA: Diagnosis not present

## 2016-12-11 DIAGNOSIS — M546 Pain in thoracic spine: Secondary | ICD-10-CM | POA: Diagnosis not present

## 2016-12-11 DIAGNOSIS — M47816 Spondylosis without myelopathy or radiculopathy, lumbar region: Secondary | ICD-10-CM | POA: Diagnosis not present

## 2016-12-11 DIAGNOSIS — M545 Low back pain: Secondary | ICD-10-CM | POA: Diagnosis not present

## 2016-12-11 DIAGNOSIS — M549 Dorsalgia, unspecified: Secondary | ICD-10-CM | POA: Diagnosis not present

## 2016-12-11 DIAGNOSIS — I700 Atherosclerosis of aorta: Secondary | ICD-10-CM | POA: Diagnosis not present

## 2017-01-10 ENCOUNTER — Encounter (HOSPITAL_COMMUNITY): Payer: Self-pay

## 2017-01-10 ENCOUNTER — Inpatient Hospital Stay (HOSPITAL_COMMUNITY)
Admission: EM | Admit: 2017-01-10 | Discharge: 2017-01-12 | DRG: 682 | Disposition: A | Discharge status: Home / Self Care | Payer: Medicare Other | Attending: Family Medicine | Admitting: Family Medicine

## 2017-01-10 ENCOUNTER — Emergency Department (HOSPITAL_COMMUNITY): Payer: Medicare Other

## 2017-01-10 DIAGNOSIS — I10 Essential (primary) hypertension: Secondary | ICD-10-CM | POA: Diagnosis present

## 2017-01-10 DIAGNOSIS — Z888 Allergy status to other drugs, medicaments and biological substances status: Secondary | ICD-10-CM

## 2017-01-10 DIAGNOSIS — Z9861 Coronary angioplasty status: Secondary | ICD-10-CM

## 2017-01-10 DIAGNOSIS — Z87440 Personal history of urinary (tract) infections: Secondary | ICD-10-CM | POA: Diagnosis not present

## 2017-01-10 DIAGNOSIS — R410 Disorientation, unspecified: Secondary | ICD-10-CM

## 2017-01-10 DIAGNOSIS — F0631 Mood disorder due to known physiological condition with depressive features: Secondary | ICD-10-CM | POA: Diagnosis present

## 2017-01-10 DIAGNOSIS — N179 Acute kidney failure, unspecified: Secondary | ICD-10-CM | POA: Diagnosis not present

## 2017-01-10 DIAGNOSIS — R69 Illness, unspecified: Secondary | ICD-10-CM | POA: Diagnosis not present

## 2017-01-10 DIAGNOSIS — G9340 Encephalopathy, unspecified: Secondary | ICD-10-CM | POA: Diagnosis not present

## 2017-01-10 DIAGNOSIS — E119 Type 2 diabetes mellitus without complications: Secondary | ICD-10-CM | POA: Diagnosis not present

## 2017-01-10 DIAGNOSIS — Z96659 Presence of unspecified artificial knee joint: Secondary | ICD-10-CM | POA: Diagnosis present

## 2017-01-10 DIAGNOSIS — E860 Dehydration: Secondary | ICD-10-CM | POA: Diagnosis present

## 2017-01-10 DIAGNOSIS — S0990XA Unspecified injury of head, initial encounter: Secondary | ICD-10-CM | POA: Diagnosis not present

## 2017-01-10 DIAGNOSIS — F329 Major depressive disorder, single episode, unspecified: Secondary | ICD-10-CM | POA: Diagnosis present

## 2017-01-10 DIAGNOSIS — R531 Weakness: Secondary | ICD-10-CM | POA: Diagnosis not present

## 2017-01-10 DIAGNOSIS — N289 Disorder of kidney and ureter, unspecified: Secondary | ICD-10-CM

## 2017-01-10 DIAGNOSIS — Z79899 Other long term (current) drug therapy: Secondary | ICD-10-CM

## 2017-01-10 DIAGNOSIS — F0390 Unspecified dementia without behavioral disturbance: Secondary | ICD-10-CM | POA: Diagnosis present

## 2017-01-10 DIAGNOSIS — E871 Hypo-osmolality and hyponatremia: Secondary | ICD-10-CM | POA: Diagnosis present

## 2017-01-10 DIAGNOSIS — Z7982 Long term (current) use of aspirin: Secondary | ICD-10-CM

## 2017-01-10 DIAGNOSIS — Z794 Long term (current) use of insulin: Secondary | ICD-10-CM | POA: Diagnosis not present

## 2017-01-10 DIAGNOSIS — E861 Hypovolemia: Secondary | ICD-10-CM | POA: Diagnosis present

## 2017-01-10 DIAGNOSIS — Z23 Encounter for immunization: Secondary | ICD-10-CM

## 2017-01-10 DIAGNOSIS — R05 Cough: Secondary | ICD-10-CM | POA: Diagnosis not present

## 2017-01-10 HISTORY — DX: Personal history of other diseases of the circulatory system: Z86.79

## 2017-01-10 HISTORY — DX: Type 2 diabetes mellitus without complications: E11.9

## 2017-01-10 LAB — BASIC METABOLIC PANEL
Anion gap: 11 (ref 5–15)
BUN: 40 mg/dL — AB (ref 6–20)
CO2: 24 mmol/L (ref 22–32)
CREATININE: 1.27 mg/dL — AB (ref 0.44–1.00)
Calcium: 9.5 mg/dL (ref 8.9–10.3)
Chloride: 96 mmol/L — ABNORMAL LOW (ref 101–111)
GFR, EST AFRICAN AMERICAN: 45 mL/min — AB (ref >60)
GFR, EST NON AFRICAN AMERICAN: 38 mL/min — AB (ref >60)
Glucose, Bld: 153 mg/dL — ABNORMAL HIGH (ref 65–99)
POTASSIUM: 4.7 mmol/L (ref 3.5–5.1)
SODIUM: 131 mmol/L — AB (ref 135–145)

## 2017-01-10 LAB — URINALYSIS, ROUTINE W REFLEX MICROSCOPIC
BILIRUBIN URINE: NEGATIVE
GLUCOSE, UA: NEGATIVE mg/dL
HGB URINE DIPSTICK: NEGATIVE
KETONES UR: NEGATIVE mg/dL
Leukocytes, UA: NEGATIVE
Nitrite: NEGATIVE
PH: 5 (ref 5.0–8.0)
Protein, ur: NEGATIVE mg/dL
SPECIFIC GRAVITY, URINE: 1.017 (ref 1.005–1.030)

## 2017-01-10 LAB — CBC
HEMATOCRIT: 44.1 % (ref 36.0–46.0)
Hemoglobin: 15.4 g/dL — ABNORMAL HIGH (ref 12.0–15.0)
MCH: 32.6 pg (ref 26.0–34.0)
MCHC: 34.9 g/dL (ref 30.0–36.0)
MCV: 93.4 fL (ref 78.0–100.0)
Platelets: 189 10*3/uL (ref 150–400)
RBC: 4.72 MIL/uL (ref 3.87–5.11)
RDW: 13.6 % (ref 11.5–15.5)
WBC: 8.7 10*3/uL (ref 4.0–10.5)

## 2017-01-10 LAB — TSH: TSH: 2.228 u[IU]/mL (ref 0.350–4.500)

## 2017-01-10 LAB — AMMONIA: Ammonia: 25 umol/L (ref 9–35)

## 2017-01-10 LAB — CBG MONITORING, ED
Glucose-Capillary: 165 mg/dL — ABNORMAL HIGH (ref 65–99)
Glucose-Capillary: 169 mg/dL — ABNORMAL HIGH (ref 65–99)

## 2017-01-10 LAB — VITAMIN B12: Vitamin B-12: 1173 pg/mL — ABNORMAL HIGH (ref 180–914)

## 2017-01-10 LAB — GLUCOSE, CAPILLARY
GLUCOSE-CAPILLARY: 168 mg/dL — AB (ref 65–99)
Glucose-Capillary: 156 mg/dL — ABNORMAL HIGH (ref 65–99)

## 2017-01-10 LAB — I-STAT TROPONIN, ED: TROPONIN I, POC: 0 ng/mL (ref 0.00–0.08)

## 2017-01-10 MED ORDER — DIAZEPAM 5 MG PO TABS: 5.0000 mg | ORAL_TABLET | Freq: Every day | ORAL | Status: DC | PRN

## 2017-01-10 MED ORDER — ONDANSETRON HCL 4 MG/2ML IJ SOLN: 4.0000 mg | Freq: Four times a day (QID) | INTRAMUSCULAR | Status: DC | PRN

## 2017-01-10 MED ORDER — SODIUM CHLORIDE 0.9 % IV BOLUS (SEPSIS)
1000.0000 mL | Freq: Once | INTRAVENOUS | Status: AC
  Administered 2017-01-10: 1000 mL via INTRAVENOUS

## 2017-01-10 MED ORDER — INSULIN ASPART 100 UNIT/ML ~~LOC~~ SOLN: 0.0000 [IU] | Freq: Every day | SUBCUTANEOUS | Status: DC

## 2017-01-10 MED ORDER — INFLUENZA VAC SPLIT HIGH-DOSE 0.5 ML IM SUSY
0.5000 mL | PREFILLED_SYRINGE | INTRAMUSCULAR | Status: AC
  Administered 2017-01-11: 0.5 mL via INTRAMUSCULAR
  Filled 2017-01-10: qty 0.5

## 2017-01-10 MED ORDER — ONDANSETRON HCL 4 MG PO TABS: 4.0000 mg | ORAL_TABLET | Freq: Four times a day (QID) | ORAL | Status: DC | PRN

## 2017-01-10 MED ORDER — ASPIRIN EC 81 MG PO TBEC
162.0000 mg | DELAYED_RELEASE_TABLET | Freq: Every day | ORAL | Status: DC
  Administered 2017-01-10 – 2017-01-11 (×2): 162 mg via ORAL
  Filled 2017-01-10 (×2): qty 2

## 2017-01-10 MED ORDER — HYDROCODONE-ACETAMINOPHEN 5-325 MG PO TABS
1.0000 | ORAL_TABLET | ORAL | Status: DC | PRN
  Administered 2017-01-11: 1 via ORAL
  Filled 2017-01-10: qty 1

## 2017-01-10 MED ORDER — INSULIN ASPART 100 UNIT/ML ~~LOC~~ SOLN
15.0000 [IU] | Freq: Three times a day (TID) | SUBCUTANEOUS | Status: DC
  Administered 2017-01-12: 15 [IU] via SUBCUTANEOUS

## 2017-01-10 MED ORDER — ACETAMINOPHEN 650 MG RE SUPP: 650.0000 mg | Freq: Four times a day (QID) | RECTAL | Status: DC | PRN

## 2017-01-10 MED ORDER — ENSURE ENLIVE PO LIQD
237.0000 mL | Freq: Two times a day (BID) | ORAL | Status: DC
  Administered 2017-01-11 – 2017-01-12 (×4): 237 mL via ORAL

## 2017-01-10 MED ORDER — INSULIN DETEMIR 100 UNIT/ML ~~LOC~~ SOLN
40.0000 [IU] | Freq: Two times a day (BID) | SUBCUTANEOUS | Status: DC
  Administered 2017-01-10 – 2017-01-12 (×4): 40 [IU] via SUBCUTANEOUS
  Filled 2017-01-10 (×5): qty 0.4

## 2017-01-10 MED ORDER — INSULIN ASPART 100 UNIT/ML ~~LOC~~ SOLN
0.0000 [IU] | Freq: Three times a day (TID) | SUBCUTANEOUS | Status: DC
  Administered 2017-01-11: 2 [IU] via SUBCUTANEOUS
  Administered 2017-01-11 – 2017-01-12 (×3): 3 [IU] via SUBCUTANEOUS

## 2017-01-10 MED ORDER — ATENOLOL 50 MG PO TABS
25.0000 mg | ORAL_TABLET | Freq: Every day | ORAL | Status: DC
  Administered 2017-01-11 – 2017-01-12 (×2): 25 mg via ORAL
  Filled 2017-01-10 (×2): qty 1

## 2017-01-10 MED ORDER — ACETAMINOPHEN 325 MG PO TABS: 650.0000 mg | ORAL_TABLET | Freq: Four times a day (QID) | ORAL | Status: DC | PRN

## 2017-01-10 MED ORDER — SENNOSIDES-DOCUSATE SODIUM 8.6-50 MG PO TABS
1.0000 | ORAL_TABLET | Freq: Every evening | ORAL | Status: DC | PRN
Start: 2017-01-10 — End: 2017-01-12
  Filled 2017-01-10: qty 1

## 2017-01-10 MED ORDER — SODIUM CHLORIDE 0.9 % IV SOLN
INTRAVENOUS | Status: AC
  Administered 2017-01-10: 21:00:00 via INTRAVENOUS

## 2017-01-10 MED ORDER — ATORVASTATIN CALCIUM 80 MG PO TABS
80.0000 mg | ORAL_TABLET | Freq: Every day | ORAL | Status: DC
  Administered 2017-01-11: 80 mg via ORAL
  Filled 2017-01-10: qty 1

## 2017-01-10 MED ORDER — BISACODYL 5 MG PO TBEC
5.0000 mg | DELAYED_RELEASE_TABLET | Freq: Every day | ORAL | Status: DC | PRN
  Filled 2017-01-10: qty 1

## 2017-01-10 MED ORDER — LIDOCAINE 5 % EX PTCH: 1.0000 | MEDICATED_PATCH | Freq: Every day | CUTANEOUS | Status: DC | PRN

## 2017-01-10 MED ORDER — HEPARIN SODIUM (PORCINE) 5000 UNIT/ML IJ SOLN
5000.0000 [IU] | Freq: Three times a day (TID) | INTRAMUSCULAR | Status: DC
  Administered 2017-01-10 – 2017-01-12 (×6): 5000 [IU] via SUBCUTANEOUS
  Filled 2017-01-10 (×6): qty 1

## 2017-01-10 NOTE — ED Provider Notes (Signed)
[facility] DEPT Provider Note   CSN: [id number] Arrival date & time: 01/10/17  1236     History   Chief Complaint Chief Complaint  Patient presents with  . Weakness    HPI Suzanne Willis is a 83 y.o. female.  The history is provided by the patient.  Weakness  Primary symptoms include loss of balance.  Primary symptoms include no focal weakness. This is a new problem. The current episode started more than 2 days ago. The problem has been gradually worsening. There was no focality noted. There has been no fever. Associated symptoms include altered mental status and confusion. Pertinent negatives include no shortness of breath, no chest pain and no vomiting.   83 year old female who presents generalized weakness. History is provided by the patient and her husband. She has a history of diabetes, hypertension, hyperlipidemia. Her husband has noted a gradual worsening of her mental status and weakness over the past 1-2 weeks. She was treated for UTI and low back pain 6 weeks ago, and it appeared that her symptoms were improving. However family began to notice generalized weakness, confusion, inappropriate speech, that was worse over the past 2 days. About 1 week ago she was started on Cymbalta as her primary care doctor felt it may be related to mild depression however this is not improved her symptoms. She has not had fevers, recent vomiting diarrhea, chest pain or difficulty breathing. She has had nonproductive cough. No dysuria, urinary frequency or other urinary or bowel symptoms. She has had increased gait instability over the past few days and to falls 1 with head strike but no loss of consciousness. Over the past day she has been unable to ambulate.   Past Medical History:  Diagnosis Date  . Diabetes mellitus without complication ([place])   . History of angina     There are no active problems to display for this patient.   Past Surgical History:  Procedure Laterality Date  .  ANGIOPLASTY    . JOINT REPLACEMENT     knee replacement  . ROTATOR CUFF REPAIR Right     OB History    No data available       Home Medications    Prior to Admission medications   Medication Sig Start Date End Date Taking? Authorizing Provider  acetaminophen (TYLENOL) 500 MG tablet Take 1,000 mg by mouth every 6 (six) hours as needed for headache (pain).   Yes [provider]  aspirin EC 81 MG tablet Take 162 mg by mouth at bedtime.   Yes [provider]  atenolol (TENORMIN) 25 MG tablet Take 25 mg by mouth daily.   Yes [provider]  diazepam (VALIUM) 5 MG tablet Take 5 mg by mouth daily as needed for anxiety.   Yes [provider]  Homeopathic Products (ARNICARE) GEL Apply 1 application topically 2 (two) times daily as needed (pain).   Yes [provider]  insulin aspart (NOVOLOG FLEXPEN) 100 UNIT/ML FlexPen Inject 26 Units into the skin 3 (three) times daily with meals.   Yes [provider]  Insulin Detemir (LEVEMIR FLEXPEN) 100 UNIT/ML Pen Inject 40 Units into the skin 2 (two) times daily.   Yes [provider]  lidocaine (LIDODERM) 5 % Place 1 patch onto the skin daily as needed (pain). Leave on for 12 hours, then remove 01/02/17  Yes [provider]  losartan-hydrochlorothiazide (HYZAAR) 50-12.5 MG tablet Take 0.5 tablets by mouth daily. 11/24/16  Yes [provider]  atorvastatin (LIPITOR)  80 MG tablet Take 80 mg by mouth at bedtime. 12/23/16   [provider]  DULoxetine (CYMBALTA) 30 MG capsule Take 30 mg by mouth daily. 01/02/17   [provider]    Family History No family history on file.  Social History Social History  Substance Use Topics  . Smoking status: Never Smoker  . Smokeless tobacco: Never Used  . Alcohol use No     Allergies   Novocain [procaine]   Review of Systems Review of Systems  Constitutional: Negative for fever.  Respiratory: Negative for  shortness of breath.   Cardiovascular: Negative for chest pain.  Gastrointestinal: Negative for nausea and vomiting.  Neurological: Positive for weakness and loss of balance. Negative for focal weakness.  Psychiatric/Behavioral: Positive for confusion.  All other systems reviewed and are negative.    Physical Exam Updated Vital Signs BP 137/62   Pulse 64   Temp (!) 97.4 F (36.3 C) (Oral)   Resp 15   SpO2 99%   Physical Exam Physical Exam  Nursing note and vitals reviewed. Constitutional: Non-toxic, and in no acute distress Head: Normocephalic and atraumatic.  Mouth/Throat: Oropharynx is clear and moist.  Neck: Normal range of motion. Neck supple.  Cardiovascular: Normal rate and regular rhythm.   Pulmonary/Chest: Effort normal and breath sounds normal.  Abdominal: Soft. There is no tenderness. There is no rebound and no guarding.  Musculoskeletal: Normal range of motion.  Neurological: Alert, Oriented to self, place, situation but no time, no facial droop, fluent speech, moves all extremities symmetrically, no dysmetria with finger to nose, sensation to light touch in tact throughout, EOMI, PERRL, antigravity in all 4 extremities for 10 seconds.  Skin: Skin is warm and dry.  Psychiatric: Cooperative   ED Treatments / Results  Labs (all labs ordered are listed, but only abnormal results are displayed) Labs Reviewed  BASIC METABOLIC PANEL - Abnormal; Notable for the following:       Result Value   Sodium 131 (*)    Chloride 96 (*)    Glucose, Bld 153 (*)    BUN 40 (*)    Creatinine, Ser 1.27 (*)    GFR calc non Af Amer 38 (*)    GFR calc Af Amer 45 (*)    All other components within normal limits  CBC - Abnormal; Notable for the following:    Hemoglobin 15.4 (*)    All other components within normal limits  URINALYSIS, ROUTINE W REFLEX MICROSCOPIC - Abnormal; Notable for the following:    APPearance HAZY (*)    All other components within normal limits  CBG  MONITORING, ED - Abnormal; Notable for the following:    Glucose-Capillary 165 (*)    All other components within normal limits  CBG MONITORING, ED - Abnormal; Notable for the following:    Glucose-Capillary 169 (*)    All other components within normal limits  TSH  I-STAT TROPONIN, ED    EKG  EKG Interpretation  Date/Time:  Saturday January 10 2017 12:47:11 EDT Ventricular Rate:  61 PR Interval:  148 QRS Duration: 88 QT Interval:  436 QTC Calculation: 438 R Axis:   13 Text Interpretation:  Normal sinus rhythm Cannot rule out Anterior infarct , age undetermined Abnormal ECG no prior EKG  Confirmed by Brantley Stage [phone]) on 01/10/2017 3:00:23 PM       Radiology Dg Chest 2 View  Result Date: 01/10/2017 CLINICAL DATA:  Cough, confusion.  Poor historian. EXAM: CHEST  2 VIEW COMPARISON:  None. FINDINGS: Cardiomediastinal silhouette is normal. Calcified aortic knob. Mild interstitial prominence. No pleural effusions or focal consolidations. Trachea projects midline and there is no pneumothorax. Soft tissue planes and included osseous structures are non-suspicious. Osteopenia. IMPRESSION: Mild interstitial prominence, possibly chronic. No focal consolidation. Aortic Atherosclerosis (ICD10-I70.0). Electronically Signed   By: Elon Alas M.D.   On: 01/10/2017 16:12   Ct Head Wo Contrast  Result Date: 01/10/2017 CLINICAL DATA:  Increased confusion for couple weeks. Increasing confusion x a couple of weeks, with inability to stand, walk today; pt fell 2 days ago, striking RIGHT forehead on corner of bed; pt denies LOC EXAM: CT HEAD WITHOUT CONTRAST TECHNIQUE: Contiguous axial images were obtained from the base of the skull through the vertex without intravenous contrast. COMPARISON:  None. FINDINGS: Brain: No acute intracranial hemorrhage. No focal mass lesion. No CT evidence of acute infarction. No midline shift or mass effect. No hydrocephalus. Basilar cisterns are patent. Extensive  confluent subcortical white matter hypodensity. Mild ventricular dilatation cortical atrophy. Vascular: No hyperdense vessel or unexpected calcification. Skull: Normal. Negative for fracture or focal lesion. Sinuses/Orbits: No acute finding. Other: None. IMPRESSION: 1. No acute intracranial findings. 2. Confluent white matter hypodensity favored small vessel ischemia. 3. Moderate atrophy and proportional ventricular dilatation. Electronically Signed   By: Suzy Bouchard M.D.   On: 01/10/2017 16:12    Procedures Procedures (including critical care time)  Medications Ordered in ED Medications  sodium chloride 0.9 % bolus 1,000 mL (0 mLs Intravenous Stopped 01/10/17 1757)     Initial Impression / Assessment and Plan / ED Course  I have reviewed the triage vital signs and the nursing notes.  Pertinent labs & imaging results that were available during my care of the patient were reviewed by me and considered in my medical decision making (see chart for details).     83 year old female who presents with generalized weakness, difficulty walking, and confusion. Gradual worsening over 1-2 weeks. Vital signs stable on arrival. No focal neuro deficits. Does look dry on exam.   Mild dehydration on blood work, and given IV fluids. No evidence of infection on chest x-ray or UA. CT head shows no acute intracranial processes. Unclear etiology of her symptoms. However with her difficulty with ambulation and intermittent confusion, did not feel that she was necessarily safe for discharge home. Discussed with Dr. Myna Hidalgo who will admit for observation.  Final Clinical Impressions(s) / ED Diagnoses   Final diagnoses:  Generalized weakness  Confusion    New Prescriptions New Prescriptions   No medications on file     Forde Dandy, MD 01/10/17 1845

## 2017-01-10 NOTE — ED Notes (Signed)
Patient fells like she needs to go to bathroom. Did bladder scan 0 urine found. Helped pastient to bathroom.   She was unable to pass urine.

## 2017-01-10 NOTE — ED Notes (Signed)
Patient transported to CT 

## 2017-01-10 NOTE — Progress Notes (Signed)
Report attempted 

## 2017-01-10 NOTE — ED Notes (Signed)
Delay in lab draw,  Pt in bathroom. 

## 2017-01-10 NOTE — ED Triage Notes (Signed)
Pt accompanied by husband who reports pt has been confused for the past several weeks.  Pt seen PCP who prescribed Cymbalta for anxiety and for neuropathy.  5 weeks ago pt was seen for lower back pain, dx: UTI, took medications.  Onset yesterday pt is unable to walk.  Pt is A&O person, place, situation, not time.  No c/o pain.

## 2017-01-10 NOTE — ED Notes (Signed)
Pt continues to be in radiology 

## 2017-01-10 NOTE — H&P (Addendum)
History and Physical    Suzanne Willis YKD:[id number] DOB: [birth year] DOA: 01/10/2017  PCP: Medicine, [facility]   Patient coming from: Home  Chief Complaint: Gen weakness, falls, confusion   HPI: Suzanne Willis is a 83 y.o. female with medical history significant for insulin-dependent diabetes mellitus and hypertension, now presenting to the emergency department for evaluation of generalized weakness and confusion. The patient is accompanied by her husband and daughter who assist with the history. She had reportedly been in her usual state of fairly good health until the insidious development of poor appetite, intermittent confusion, and generalized weakness resulting in some falls, developing over the past 1-2 weeks. Patient was evaluated by her PCP early in the course, there was concern that the presentation may reflect depression, and she was started on Cymbalta. She seemed to be worsening on the Cymbalta, and this was stopped 3 days ago. She has noted a slight nonproductive cough, but denies any dyspnea, chest pain, fevers, or chills. She was recently treated for UTI, but those symptoms have resolved weeks ago. Patient has denied headache, change in vision or hearing, or focal numbness or weakness.  ED Course: Upon arrival to the ED, patient is found to be afebrile, saturating well on room air, and with vitals otherwise stable. EKG features a normal sinus rhythm and chest x-ray demonstrates some mild interstitial prominence, possibly chronic. Noncontrast head CT is negative for acute intracranial abnormality. Chemistry panel features a mild hyponatremia and BUN of 40 with serum creatinine 1.27 and no priors in the past several years for comparison. CBC is notable for an elevated hemoglobin of 15.4. TSH is normal. Urinalysis is unremarkable. Troponin is undetectable. She was given a liter of normal saline in the emergency department, remained hemodynamically stable, and in no apparent  respiratory distress, and she will be observed on the medical/surgical unit for ongoing evaluation and management intermittent confusion and generalized weakness of uncertain etiology.  Review of Systems:  All other systems reviewed and apart from HPI, are negative.  Past Medical History:  Diagnosis Date  . Diabetes mellitus without complication ([place])   . History of angina     Past Surgical History:  Procedure Laterality Date  . ANGIOPLASTY    . JOINT REPLACEMENT     knee replacement  . ROTATOR CUFF REPAIR Right      reports that she has never smoked. She has never used smokeless tobacco. She reports that she does not drink alcohol or use drugs.  Allergies  Allergen Reactions  . Novocain [Procaine] Other (See Comments)    Cardiologist told pt never to use novocain    Family History  Problem Relation Age of Onset  . Obesity Daughter      Prior to Admission medications   Medication Sig Start Date End Date Taking? Authorizing Provider  acetaminophen (TYLENOL) 500 MG tablet Take 1,000 mg by mouth every 6 (six) hours as needed for headache (pain).   Yes [provider]  aspirin EC 81 MG tablet Take 162 mg by mouth at bedtime.   Yes [provider]  atenolol (TENORMIN) 25 MG tablet Take 25 mg by mouth daily.   Yes [provider]  diazepam (VALIUM) 5 MG tablet Take 5 mg by mouth daily as needed for anxiety.   Yes [provider]  Homeopathic Products (ARNICARE) GEL Apply 1 application topically 2 (two) times daily as needed (pain).   Yes [provider]  insulin aspart (NOVOLOG FLEXPEN) 100 UNIT/ML FlexPen Inject 26 Units  into the skin 3 (three) times daily with meals.   Yes [provider]  Insulin Detemir (LEVEMIR FLEXPEN) 100 UNIT/ML Pen Inject 40 Units into the skin 2 (two) times daily.   Yes [provider]  lidocaine (LIDODERM) 5 % Place 1 patch onto the skin daily as needed (pain). Leave on for 12 hours, then  remove 01/02/17  Yes [provider]  losartan-hydrochlorothiazide (HYZAAR) 50-12.5 MG tablet Take 0.5 tablets by mouth daily. 11/24/16  Yes [provider]  atorvastatin (LIPITOR) 80 MG tablet Take 80 mg by mouth at bedtime. 12/23/16   [provider]  DULoxetine (CYMBALTA) 30 MG capsule Take 30 mg by mouth daily. 01/02/17   [provider]    Physical Exam: Vitals:   01/10/17 1304 01/10/17 1636 01/10/17 1730  BP: (!) 131/50 (!) 93/49 137/62  Pulse: 61 61 64  Resp: 16 14 15   Temp: (!) 97.4 F (36.3 C)    TempSrc: Oral    SpO2: 98% 100% 99%      Constitutional: NAD, calm, comfortable Eyes: PERTLA, lids and conjunctivae normal ENMT: Mucous membranes are moist. Posterior pharynx clear of any exudate or lesions.   Neck: normal, supple, no masses, no thyromegaly Respiratory: clear to auscultation bilaterally, no wheezing, no crackles. Normal respiratory effort.   Cardiovascular: S1 & S2 heard, regular rate and rhythm. No extremity edema. No significant JVD. Abdomen: No distension, no tenderness, no masses palpated. Bowel sounds normal.  Musculoskeletal: no clubbing / cyanosis. No joint deformity upper and lower extremities.    Skin: no significant rashes, lesions, ulcers. Warm, dry, well-perfused. Neurologic: CN 2-12 grossly intact. Sensation intact. Strength 5/5 in all 4 limbs.  Psychiatric: Alert and oriented x 3. Pleasant and cooperative. Mild intermittent confusion.     Labs on Admission: I have personally reviewed following labs and imaging studies  CBC:  Recent Labs Lab 01/10/17 1300  WBC 8.7  HGB 15.4*  HCT 44.1  MCV 93.4  PLT 563   Basic Metabolic Panel:  Recent Labs Lab 01/10/17 1300  NA 131*  K 4.7  CL 96*  CO2 24  GLUCOSE 153*  BUN 40*  CREATININE 1.27*  CALCIUM 9.5   GFR: CrCl cannot be calculated (Unknown ideal weight.). Liver Function Tests: No results for input(s): AST, ALT, ALKPHOS, BILITOT, PROT, ALBUMIN in the  last 168 hours. No results for input(s): LIPASE, AMYLASE in the last 168 hours. No results for input(s): AMMONIA in the last 168 hours. Coagulation Profile: No results for input(s): INR, PROTIME in the last 168 hours. Cardiac Enzymes: No results for input(s): CKTOTAL, CKMB, CKMBINDEX, TROPONINI in the last 168 hours. BNP (last 3 results) No results for input(s): PROBNP in the last 8760 hours. HbA1C: No results for input(s): HGBA1C in the last 72 hours. CBG:  Recent Labs Lab 01/10/17 1309 01/10/17 1504  GLUCAP 165* 169*   Lipid Profile: No results for input(s): CHOL, HDL, LDLCALC, TRIG, CHOLHDL, LDLDIRECT in the last 72 hours. Thyroid Function Tests:  Recent Labs  01/10/17 1631  TSH 2.228   Anemia Panel: No results for input(s): VITAMINB12, FOLATE, FERRITIN, TIBC, IRON, RETICCTPCT in the last 72 hours. Urine analysis:    Component Value Date/Time   COLORURINE YELLOW 01/10/2017 1753   APPEARANCEUR HAZY (A) 01/10/2017 1753   LABSPEC 1.017 01/10/2017 1753   PHURINE 5.0 01/10/2017 1753   GLUCOSEU NEGATIVE 01/10/2017 1753   HGBUR NEGATIVE 01/10/2017 1753   BILIRUBINUR NEGATIVE 01/10/2017 1753   KETONESUR NEGATIVE 01/10/2017 1753  PROTEINUR NEGATIVE 01/10/2017 1753   NITRITE NEGATIVE 01/10/2017 1753   LEUKOCYTESUR NEGATIVE 01/10/2017 1753   Sepsis Labs: @LABRCNTIP (procalcitonin:4,lacticidven:4) )No results found for this or any previous visit (from the past 240 hour(s)).   Radiological Exams on Admission: Dg Chest 2 View  Result Date: 01/10/2017 CLINICAL DATA:  Cough, confusion.  Poor historian. EXAM: CHEST  2 VIEW COMPARISON:  None. FINDINGS: Cardiomediastinal silhouette is normal. Calcified aortic knob. Mild interstitial prominence. No pleural effusions or focal consolidations. Trachea projects midline and there is no pneumothorax. Soft tissue planes and included osseous structures are non-suspicious. Osteopenia. IMPRESSION: Mild interstitial prominence, possibly  chronic. No focal consolidation. Aortic Atherosclerosis (ICD10-I70.0). Electronically Signed   By: Elon Alas M.D.   On: 01/10/2017 16:12   Ct Head Wo Contrast  Result Date: 01/10/2017 CLINICAL DATA:  Increased confusion for couple weeks. Increasing confusion x a couple of weeks, with inability to stand, walk today; pt fell 2 days ago, striking RIGHT forehead on corner of bed; pt denies LOC EXAM: CT HEAD WITHOUT CONTRAST TECHNIQUE: Contiguous axial images were obtained from the base of the skull through the vertex without intravenous contrast. COMPARISON:  None. FINDINGS: Brain: No acute intracranial hemorrhage. No focal mass lesion. No CT evidence of acute infarction. No midline shift or mass effect. No hydrocephalus. Basilar cisterns are patent. Extensive confluent subcortical white matter hypodensity. Mild ventricular dilatation cortical atrophy. Vascular: No hyperdense vessel or unexpected calcification. Skull: Normal. Negative for fracture or focal lesion. Sinuses/Orbits: No acute finding. Other: None. IMPRESSION: 1. No acute intracranial findings. 2. Confluent white matter hypodensity favored small vessel ischemia. 3. Moderate atrophy and proportional ventricular dilatation. Electronically Signed   By: Suzy Bouchard M.D.   On: 01/10/2017 16:12    EKG: Independently reviewed. Normal sinus rhythm.   Assessment/Plan  1. Acute encephalopathy, generalized weakness  - Pt presents with 1-2 wks of progressive loss of appetite, waxing and waning confusion, and generalized weakness  - She was evaluated by PCP early in the course, depression was considered, and she was started on Cymbalta; seemed to worsen with this and it was stopped on 9/19  - There is no focal neurologic deficit and head CT is negative for acute intracranial abnormality  - Basic labs notable for mild renal insufficiency, UA unremarkable, TSH wnl  - Ddx is quite broad; could be early dementia, vitamin deficiency, medication  affect, etc  - Plan to extend the lab workup with RPR, ammonia, B12, and folate levels, ask PT to eval and treat   2. Insulin-dependent DM  - No A1c on file  - Managed at home with Levemir 40 units BID and Novolog 26 units TID with meals  - Check CBG with meals and qHS   - Continue Levemir 40 units BID, Novolog 15 units TID, and add sliding-scale correctional   3. Hypertension  - BP at goal  - Managed at home with atenolol and losartan-HCTZ  - Plant to continue atenalol and hold Hyzaar given kidney disease of uncertain chronicity   4. Renal insufficiency, mild  - SCr is 1.27 on admission; no priors available  - She appeared hypovolemic on presentation and was given a liter of NS in ED  - Plan to continue NS infusion overnight, hold Hyzaar, and repeat chem panel in am     DVT prophylaxis: sq heparin  Code Status: Full  Family Communication: Husband and daughter updated at bedside Disposition Plan: Observe on med-surg Consults called: None Admission status: Observation    Ilene Qua  Opyd, MD [facility] Pager [phone]  If 7PM-7AM, please contact night-coverage [email or web address] Password [facility]  01/10/2017, 7:53 PM

## 2017-01-11 ENCOUNTER — Observation Stay (HOSPITAL_COMMUNITY): Payer: Medicare Other

## 2017-01-11 DIAGNOSIS — N289 Disorder of kidney and ureter, unspecified: Secondary | ICD-10-CM | POA: Diagnosis not present

## 2017-01-11 DIAGNOSIS — E860 Dehydration: Secondary | ICD-10-CM | POA: Diagnosis present

## 2017-01-11 DIAGNOSIS — R531 Weakness: Secondary | ICD-10-CM | POA: Diagnosis not present

## 2017-01-11 DIAGNOSIS — R410 Disorientation, unspecified: Secondary | ICD-10-CM | POA: Diagnosis not present

## 2017-01-11 DIAGNOSIS — F0390 Unspecified dementia without behavioral disturbance: Secondary | ICD-10-CM | POA: Diagnosis present

## 2017-01-11 DIAGNOSIS — E119 Type 2 diabetes mellitus without complications: Secondary | ICD-10-CM | POA: Diagnosis not present

## 2017-01-11 DIAGNOSIS — I10 Essential (primary) hypertension: Secondary | ICD-10-CM | POA: Diagnosis present

## 2017-01-11 DIAGNOSIS — Z7982 Long term (current) use of aspirin: Secondary | ICD-10-CM | POA: Diagnosis not present

## 2017-01-11 DIAGNOSIS — F329 Major depressive disorder, single episode, unspecified: Secondary | ICD-10-CM | POA: Diagnosis present

## 2017-01-11 DIAGNOSIS — G9340 Encephalopathy, unspecified: Secondary | ICD-10-CM | POA: Diagnosis not present

## 2017-01-11 DIAGNOSIS — F0631 Mood disorder due to known physiological condition with depressive features: Secondary | ICD-10-CM | POA: Diagnosis not present

## 2017-01-11 DIAGNOSIS — E871 Hypo-osmolality and hyponatremia: Secondary | ICD-10-CM | POA: Diagnosis present

## 2017-01-11 DIAGNOSIS — N179 Acute kidney failure, unspecified: Secondary | ICD-10-CM | POA: Diagnosis present

## 2017-01-11 DIAGNOSIS — Z888 Allergy status to other drugs, medicaments and biological substances status: Secondary | ICD-10-CM | POA: Diagnosis not present

## 2017-01-11 DIAGNOSIS — Z794 Long term (current) use of insulin: Secondary | ICD-10-CM | POA: Diagnosis not present

## 2017-01-11 DIAGNOSIS — Z79899 Other long term (current) drug therapy: Secondary | ICD-10-CM | POA: Diagnosis not present

## 2017-01-11 DIAGNOSIS — Z23 Encounter for immunization: Secondary | ICD-10-CM | POA: Diagnosis not present

## 2017-01-11 DIAGNOSIS — Z87440 Personal history of urinary (tract) infections: Secondary | ICD-10-CM | POA: Diagnosis not present

## 2017-01-11 DIAGNOSIS — Z96659 Presence of unspecified artificial knee joint: Secondary | ICD-10-CM | POA: Diagnosis present

## 2017-01-11 DIAGNOSIS — R4020 Unspecified coma: Secondary | ICD-10-CM | POA: Diagnosis not present

## 2017-01-11 DIAGNOSIS — Z9861 Coronary angioplasty status: Secondary | ICD-10-CM | POA: Diagnosis not present

## 2017-01-11 DIAGNOSIS — E861 Hypovolemia: Secondary | ICD-10-CM | POA: Diagnosis present

## 2017-01-11 LAB — GLUCOSE, CAPILLARY
GLUCOSE-CAPILLARY: 109 mg/dL — AB (ref 65–99)
GLUCOSE-CAPILLARY: 138 mg/dL — AB (ref 65–99)
GLUCOSE-CAPILLARY: 148 mg/dL — AB (ref 65–99)

## 2017-01-11 LAB — BASIC METABOLIC PANEL
Anion gap: 7 (ref 5–15)
BUN: 28 mg/dL — AB (ref 6–20)
CALCIUM: 8.7 mg/dL — AB (ref 8.9–10.3)
CO2: 24 mmol/L (ref 22–32)
Chloride: 105 mmol/L (ref 101–111)
Creatinine, Ser: 0.88 mg/dL (ref 0.44–1.00)
GFR calc Af Amer: >60 mL/min (ref >60)
GFR, EST NON AFRICAN AMERICAN: 60 mL/min — AB (ref >60)
Glucose, Bld: 112 mg/dL — ABNORMAL HIGH (ref 65–99)
POTASSIUM: 3.8 mmol/L (ref 3.5–5.1)
SODIUM: 136 mmol/L (ref 135–145)

## 2017-01-11 LAB — CBC
HEMATOCRIT: 38.5 % (ref 36.0–46.0)
Hemoglobin: 13.2 g/dL (ref 12.0–15.0)
MCH: 32.1 pg (ref 26.0–34.0)
MCHC: 34.3 g/dL (ref 30.0–36.0)
MCV: 93.7 fL (ref 78.0–100.0)
PLATELETS: 146 10*3/uL — AB (ref 150–400)
RBC: 4.11 MIL/uL (ref 3.87–5.11)
RDW: 13.8 % (ref 11.5–15.5)
WBC: 7.2 10*3/uL (ref 4.0–10.5)

## 2017-01-11 LAB — RPR: RPR: NONREACTIVE

## 2017-01-11 NOTE — Progress Notes (Signed)
PROGRESS NOTE [facility]   Karson Chicas   UXL:[id number] DOB: 1933-07-04  DOA: 01/10/2017 PCP: Medicine, [facility]   Brief Narrative:  Suzanne Willis is a 83 year old female with medical history significant for diabetes mellitus type 2, hypertension presented to the emergency department for evaluation of generalized weakness and confusion. Per husband patient presenting with intermittent confusion for the past 1-2 week was evaluated by her PCP and was treated as depression when she was started Cymbalta. Subsequently she seems to be worse on Cymbalta was stopped, although patient took 2 Cymbalta pills 2 days prior to admission. Open ED evaluation patient was found to be with acute renal failure, UA negative, normal troponin and normal TSH. Patient was admitted for further workup. Family requesting psychiatry evaluation. MRI and CT negative for strokes.   Subjective: Patient seen and examined, has no complaints she is alert and oriented. Report weakness has improved but not back to baseline. Denies chest pain, short of breath and palpitations  Assessment & Plan: Acute encephalopathy dehydration contributing to these symptoms, as well dementia. Family reports some signs of depression which might be contributing as well. ? Medication side effect from Cymbalta. I suspect that declining for CT head and MRI -negative for acute findings TSH normal, ammonia levels normal. UA with all abnormalities Chest x-ray with no signs of infection She received IV fluids and improving. Psych was consulted   Acute renal failure - likely contributing to weakness  Cr admission 1.27 >> 0.88 after hydration  Check BMP in AM   DM type 2 CGB stable  Continue current regimen   HTN  BP stable  Continue home medication   DVT prophylaxis: Heparin  Code Status: Full  Family Communication: Husband by phone Disposition Plan: Home in next 24 hrs after psych eval   Consultants:   Psych    Procedures:   None   Antimicrobials:  None     Objective: Vitals:   01/10/17 2234 01/11/17 0457 01/11/17 1031 01/11/17 1520  BP: (!) 121/54 (!) 108/40 (!) 110/46 (!) 113/41  Pulse: 65 71 75 64  Resp: 18 17  14   Temp: 98.2 F (36.8 C) 98.5 F (36.9 C)  98 F (36.7 C)  TempSrc: Oral Oral  Oral  SpO2: 96% 98%  99%  Weight:  66.8 kg (147 lb 4.3 oz)    Height:  5\' 4"  (1.626 m)      Intake/Output Summary (Last 24 hours) at 01/11/17 1637 Last data filed at 01/11/17 5366  Gross per 24 hour  Intake             1843 ml  Output                0 ml  Net             1843 ml   Filed Weights   01/10/17 2055 01/11/17 0457  Weight: 66 kg (145 lb 8.1 oz) 66.8 kg (147 lb 4.3 oz)    Examination:  General exam: Appears calm and comfortable  HEENT: AC/AT, PERRLA, OP moist and clear Respiratory system: Clear to auscultation. No wheezes,crackle or rhonchi Cardiovascular system: S1 & S2 heard, RRR. No JVD, murmurs, rubs or gallops Gastrointestinal system: Abdomen is nondistended, soft and nontender. Normal bowel sounds heard. Central nervous system: Alert and oriented. No focal neurological deficits. Extremities: No pedal edema. Symmetric, strength 5/5   Skin: No rashes, lesions or ulcers Psychiatry: Mood & affect appropriate.    Data Reviewed: I have personally reviewed following  labs and imaging studies  CBC:  Recent Labs Lab 01/10/17 1300 01/11/17 0545  WBC 8.7 7.2  HGB 15.4* 13.2  HCT 44.1 38.5  MCV 93.4 93.7  PLT 189 269*   Basic Metabolic Panel:  Recent Labs Lab 01/10/17 1300 01/11/17 0545  NA 131* 136  K 4.7 3.8  CL 96* 105  CO2 24 24  GLUCOSE 153* 112*  BUN 40* 28*  CREATININE 1.27* 0.88  CALCIUM 9.5 8.7*   GFR: Estimated Creatinine Clearance: 47.1 mL/min (by C-G formula based on SCr of 0.88 mg/dL). Liver Function Tests: No results for input(s): AST, ALT, ALKPHOS, BILITOT, PROT, ALBUMIN in the last 168 hours. No results for input(s): LIPASE,  AMYLASE in the last 168 hours.  Recent Labs Lab 01/10/17 2030  AMMONIA 25   Coagulation Profile: No results for input(s): INR, PROTIME in the last 168 hours. Cardiac Enzymes: No results for input(s): CKTOTAL, CKMB, CKMBINDEX, TROPONINI in the last 168 hours. BNP (last 3 results) No results for input(s): PROBNP in the last 8760 hours. HbA1C: No results for input(s): HGBA1C in the last 72 hours. CBG:  Recent Labs Lab 01/10/17 1309 01/10/17 1504 01/10/17 2105 01/10/17 2237 01/11/17 0842  GLUCAP 165* 169* 156* 168* 109*   Lipid Profile: No results for input(s): CHOL, HDL, LDLCALC, TRIG, CHOLHDL, LDLDIRECT in the last 72 hours. Thyroid Function Tests:  Recent Labs  01/10/17 1631  TSH 2.228   Anemia Panel:  Recent Labs  01/10/17 2030  VITAMINB12 1,173*   Sepsis Labs: No results for input(s): PROCALCITON, LATICACIDVEN in the last 168 hours.  No results found for this or any previous visit (from the past 240 hour(s)).    Radiology Studies: Dg Chest 2 View  Result Date: 01/10/2017 CLINICAL DATA:  Cough, confusion.  Poor historian. EXAM: CHEST  2 VIEW COMPARISON:  None. FINDINGS: Cardiomediastinal silhouette is normal. Calcified aortic knob. Mild interstitial prominence. No pleural effusions or focal consolidations. Trachea projects midline and there is no pneumothorax. Soft tissue planes and included osseous structures are non-suspicious. Osteopenia. IMPRESSION: Mild interstitial prominence, possibly chronic. No focal consolidation. Aortic Atherosclerosis (ICD10-I70.0). Electronically Signed   By: Elon Alas M.D.   On: 01/10/2017 16:12   Ct Head Wo Contrast  Result Date: 01/10/2017 CLINICAL DATA:  Increased confusion for couple weeks. Increasing confusion x a couple of weeks, with inability to stand, walk today; pt fell 2 days ago, striking RIGHT forehead on corner of bed; pt denies LOC EXAM: CT HEAD WITHOUT CONTRAST TECHNIQUE: Contiguous axial images were  obtained from the base of the skull through the vertex without intravenous contrast. COMPARISON:  None. FINDINGS: Brain: No acute intracranial hemorrhage. No focal mass lesion. No CT evidence of acute infarction. No midline shift or mass effect. No hydrocephalus. Basilar cisterns are patent. Extensive confluent subcortical white matter hypodensity. Mild ventricular dilatation cortical atrophy. Vascular: No hyperdense vessel or unexpected calcification. Skull: Normal. Negative for fracture or focal lesion. Sinuses/Orbits: No acute finding. Other: None. IMPRESSION: 1. No acute intracranial findings. 2. Confluent white matter hypodensity favored small vessel ischemia. 3. Moderate atrophy and proportional ventricular dilatation. Electronically Signed   By: Suzy Bouchard M.D.   On: 01/10/2017 16:12   Mr Brain Wo Contrast  Result Date: 01/11/2017 CLINICAL DATA:  Altered level of consciousness.  Diabetes. EXAM: MRI HEAD WITHOUT CONTRAST TECHNIQUE: Multiplanar, multiecho pulse sequences of the brain and surrounding structures were obtained without intravenous contrast. COMPARISON:  CT head 01/10/2017 FINDINGS: Brain: Mild atrophy. Negative for hydrocephalus. Extensive white matter  hyperintensity diffusely. Patchy hyperintensity in the thalamus and pons bilaterally. Negative for acute infarct, hemorrhage, mass Vascular: Normal arterial flow voids Skull and upper cervical spine: Negative Sinuses/Orbits: Negative Other: None IMPRESSION: Atrophy and extensive chronic microvascular ischemia. No acute abnormality. Electronically Signed   By: Franchot Gallo M.D.   On: 01/11/2017 15:14      Scheduled Meds: . aspirin EC  162 mg Oral QHS  . atenolol  25 mg Oral Daily  . atorvastatin  80 mg Oral q1800  . feeding supplement (ENSURE ENLIVE)  237 mL Oral BID BM  . heparin  5,000 Units Subcutaneous Q8H  . insulin aspart  0-15 Units Subcutaneous TID WC  . insulin aspart  0-5 Units Subcutaneous QHS  . insulin aspart  15  Units Subcutaneous TID WC  . insulin detemir  40 Units Subcutaneous BID   Continuous Infusions:   LOS: 0 days    Time spent: Total of 25 minutes spent with pt, greater than 50% of which was spent in discussion of  treatment, counseling and coordination of care    Chipper Oman, MD Pager: Text Page via [email or web address]   If 7PM-7AM, please contact night-coverage [email or web address] 01/11/2017, 4:37 PM

## 2017-01-11 NOTE — Evaluation (Signed)
Physical Therapy Evaluation Patient Details Name: Suzanne Willis MRN: [id number] DOB: [birth year] Today's Date: 01/11/2017   History of Present Illness  Suzanne Willis is a 83 y.o. female with medical history significant for insulin-dependent diabetes mellitus and hypertension, now presenting to the emergency department for evaluation of generalized weakness and confusion. Pt also with recent falls per chart.    Clinical Impression  Pt admitted with above diagnosis. Pt currently with functional limitations due to the deficits listed below (see PT Problem List).  Pt will benefit from skilled PT to increase their independence and safety with mobility to allow discharge to the venue listed below.  Pt required RW to ambulate due to unsteadiness and weakness in knees with gait.  Education provided on using RW at this time with gait and having A with her on the stairs.  Pt verbalized understanding.  Recommend HHPT to work towards returning to her PLOF.     Follow Up Recommendations Home health PT;Supervision for mobility/OOB;Supervision/Assistance - 24 hour    Equipment Recommendations  None recommended by PT    Recommendations for Other Services       Precautions / Restrictions Precautions Precautions: Fall Restrictions Weight Bearing Restrictions: No      Mobility  Bed Mobility Overal bed mobility: Needs Assistance Bed Mobility: Supine to Sit     Supine to sit: Supervision        Transfers Overall transfer level: Needs assistance Equipment used: None Transfers: Sit to/from Stand Sit to Stand: Min guard         General transfer comment: cues for hand placement  Ambulation/Gait Ambulation/Gait assistance: Min guard Ambulation Distance (Feet): 60 Feet Assistive device: Rolling walker (2 wheeled) Gait Pattern/deviations: Decreased step length - right;Decreased step length - left;Trunk flexed Gait velocity: decreased   General Gait Details: Initially attempted ambulation  with IV pole only as pt normally ambulates without AD, but pt also reaching out for support from sink.  Returned to bed and got RW.  Pt with weak knees throughout gait, but no buckeling. Pt needed multi-modal cues for RW positioning and to step up into RW.  Stairs            Wheelchair Mobility    Modified Rankin (Stroke Patients Only)       Balance Overall balance assessment: History of Falls;Needs assistance   Sitting balance-Leahy Scale: Fair       Standing balance-Leahy Scale: Poor Standing balance comment: requires UE support                             Pertinent Vitals/Pain Pain Assessment: No/denies pain    Home Living Family/patient expects to be discharged to:: Private residence Living Arrangements: Spouse/significant other Available Help at Discharge: Family Type of Home: House Home Access: Stairs to enter Entrance Stairs-Rails: None Entrance Stairs-Number of Steps: 2 Home Layout: Multi-level Home Equipment: Environmental consultant - 2 wheels;Cane - single point;Bedside commode      Prior Function Level of Independence: Independent               Hand Dominance        Extremity/Trunk Assessment   Upper Extremity Assessment Upper Extremity Assessment: Overall WFL for tasks assessed    Lower Extremity Assessment Lower Extremity Assessment: Overall WFL for tasks assessed;Generalized weakness    Cervical / Trunk Assessment Cervical / Trunk Assessment: Kyphotic  Communication   Communication: No difficulties  Cognition Arousal/Alertness: Awake/alert   Overall Cognitive Status: Within  Functional Limits for tasks assessed Area of Impairment: Memory;Safety/judgement                     Memory: Decreased short-term memory   Safety/Judgement: Decreased awareness of deficits     General Comments: Pt with difficulty remembering some things during eval, but fluctuated.      General Comments      Exercises     Assessment/Plan     PT Assessment Patient needs continued PT services  PT Problem List Decreased strength;Decreased activity tolerance;Decreased balance;Decreased mobility;Decreased knowledge of use of DME       PT Treatment Interventions DME instruction;Gait training;Stair training;Functional mobility training;Therapeutic activities;Therapeutic exercise;Balance training;Patient/family education    PT Goals (Current goals can be found in the Care Plan section)  Acute Rehab PT Goals Patient Stated Goal: return to walking like she was before. PT Goal Formulation: With patient Time For Goal Achievement: 01/25/17 Potential to Achieve Goals: Good    Frequency Min 3X/week   Barriers to discharge Inaccessible home environment      Co-evaluation               AM-PAC PT "6 Clicks" Daily Activity  Outcome Measure Difficulty turning over in bed (including adjusting bedclothes, sheets and blankets)?: None Difficulty moving from lying on back to sitting on the side of the bed? : None Difficulty sitting down on and standing up from a chair with arms (e.g., wheelchair, bedside commode, etc,.)?: A Little Help needed moving to and from a bed to chair (including a wheelchair)?: A Little Help needed walking in hospital room?: A Little Help needed climbing 3-5 steps with a railing? : A Lot 6 Click Score: 19    End of Session Equipment Utilized During Treatment: Gait belt Activity Tolerance: Patient tolerated treatment well Patient left: in chair;with call bell/phone within reach;with chair alarm set Nurse Communication: Mobility status PT Visit Diagnosis: Unsteadiness on feet (R26.81);Difficulty in walking, not elsewhere classified (R26.2);Muscle weakness (generalized) (M62.81)    Time: 0370-4888 PT Time Calculation (min) (ACUTE ONLY): 33 min   Charges:   PT Evaluation $PT Eval Moderate Complexity: 1 Mod PT Treatments $Gait Training: 8-22 mins   PT G Codes:   PT G-Codes **NOT FOR INPATIENT  CLASS** Functional Assessment Tool Used: AM-PAC 6 Clicks Basic Mobility Functional Limitation: Mobility: Walking and moving around Mobility: Walking and Moving Around Current Status (B1694): At least 20 percent but less than 40 percent impaired, limited or restricted Mobility: Walking and Moving Around Goal Status [phone]): At least 1 percent but less than 20 percent impaired, limited or restricted    Santiago Glad L. Tamala Julian, [place] Pager [phone] 01/11/2017   Galen Manila 01/11/2017, 9:33 AM

## 2017-01-12 DIAGNOSIS — F0631 Mood disorder due to known physiological condition with depressive features: Secondary | ICD-10-CM

## 2017-01-12 LAB — GLUCOSE, CAPILLARY
GLUCOSE-CAPILLARY: 107 mg/dL — AB (ref 65–99)
GLUCOSE-CAPILLARY: 184 mg/dL — AB (ref 65–99)
Glucose-Capillary: 179 mg/dL — ABNORMAL HIGH (ref 65–99)
Glucose-Capillary: 182 mg/dL — ABNORMAL HIGH (ref 65–99)

## 2017-01-12 LAB — BASIC METABOLIC PANEL
ANION GAP: 7 (ref 5–15)
BUN: 22 mg/dL — ABNORMAL HIGH (ref 6–20)
CALCIUM: 8.9 mg/dL (ref 8.9–10.3)
CO2: 24 mmol/L (ref 22–32)
Chloride: 104 mmol/L (ref 101–111)
Creatinine, Ser: 0.88 mg/dL (ref 0.44–1.00)
GFR calc Af Amer: >60 mL/min (ref >60)
GFR, EST NON AFRICAN AMERICAN: 60 mL/min — AB (ref >60)
GLUCOSE: 122 mg/dL — AB (ref 65–99)
POTASSIUM: 4.1 mmol/L (ref 3.5–5.1)
SODIUM: 135 mmol/L (ref 135–145)

## 2017-01-12 MED ORDER — GLUCERNA SHAKE PO LIQD: 237.0000 mL | Freq: Three times a day (TID) | ORAL | 20 refills | Status: AC

## 2017-01-12 MED ORDER — MIRTAZAPINE 15 MG PO TABS: 7.5000 mg | ORAL_TABLET | Freq: Every day | ORAL | 0 refills | Status: AC

## 2017-01-12 MED ORDER — GLUCERNA SHAKE PO LIQD: 237.0000 mL | Freq: Three times a day (TID) | ORAL | Status: DC

## 2017-01-12 NOTE — Progress Notes (Signed)
Nutrition Follow-up  DOCUMENTATION CODES:   Not applicable  INTERVENTION:   -D/c Ensure Enlive po BID, each supplement provides 350 kcal and 20 grams of protein -Glucerna Shake po TID, each supplement provides 220 kcal and 10 grams of protein  NUTRITION DIAGNOSIS:   Predicted suboptimal nutrient intake related to  (depression) as evidenced by per patient/family report.  GOAL:   Patient will meet greater than or equal to 90% of their needs  MONITOR:   PO intake, Supplement acceptance, Labs, Weight trends, Skin, I & O's  REASON FOR ASSESSMENT:   Malnutrition Screening Tool    ASSESSMENT:   Suzanne Willis is a 83 y.o. female with medical history significant for insulin-dependent diabetes mellitus and hypertension, now presenting to the emergency department for evaluation of generalized weakness and confusion.  Pt admitted with generalized weakness and acute encephalopathy.   Per chart review, pt requesting psych evaluation. Pt had had a general decline in mobility per PT notes, who are recommending home health at discharge.   Pt unavailable at times of visits. Unable to obtain further nutrition hx or complete nutrition-focused physical exam at this time.   Hx is limited. No wt hx available from chart or CareEverywhere, so unsure if pt has lost weight. Per MST report, pt has complained of both unintentional wt loss and a decreased appetite. She is consuming ordered Ensure supplements.   Labs reviewed: CBGS: 104-184 (inpatient orders for glucemic control are 15 units insulin aspart TID, 0-5 units insulin aspart TID with meals, 0-5 units insulin aspart q HS, and 40 units insulin determir BID).   Diet Order:  Diet Carb Modified Fluid consistency: Thin; Room service appropriate? Yes Diet - low sodium heart healthy  Skin:  Reviewed, no issues  Last BM:  01/11/17  Height:   Ht Readings from Last 1 Encounters:  01/11/17 5\' 4"  (1.626 m)    Weight:   Wt Readings from Last 1  Encounters:  01/12/17 150 lb 5.7 oz (68.2 kg)    Ideal Body Weight:  54.5 kg  BMI:  Body mass index is 25.81 kg/m.  Estimated Nutritional Needs:   Kcal:  1500-1700  Protein:  70-85 grams  Fluid:  1.5-1.7 L  EDUCATION NEEDS:   Education needs no appropriate at this time  Cliffie Gingras A. Jimmye Norman, RD, LDN, CDE Pager: [phone] After hours Pager: [phone]

## 2017-01-12 NOTE — Discharge Summary (Signed)
Physician Discharge Summary  Suzanne Willis  FYB:[id number]  DOB: 02/22/1934  DOA: 01/10/2017 PCP: Medicine, [facility]  Admit date: 01/10/2017 Discharge date: 01/12/2017  Admitted From: Home Disposition:  Home   Recommendations for Outpatient Follow-up:  1. Follow up with PCP in 1 weeks 2. Please obtain BMP/CBC in one week to monitor renal function and Hgb   Home Health: PT/OT RN Equipment/Devices: None   Discharge Condition: Stable  CODE STATUS: Full code  Diet recommendation: Heart Healthy   Brief/Interim Summary: Suzanne Willis is a 83 year old female with medical history significant for diabetes mellitus type 2, hypertension presented to the emergency department for evaluation of generalized weakness and confusion. Per husband patient presenting with intermittent confusion for the past 1-2 week was evaluated by her PCP and was treated as depression when she was started Cymbalta. Subsequently she seems to be worse on Cymbalta was stopped, although patient took 2 Cymbalta pills 2 days prior to admission. Open ED evaluation patient was found to be with acute renal failure, UA negative, normal troponin and normal TSH. Patient was admitted for further workup. Family requesting psychiatry evaluation. CT and MRI negative for strokes. Bilateral weakness was attributed to dehydration as patient on admission presented with renal failure, symptoms improved after IVF. Patient was evaluated by PT whom recommended home health. Family requested psych evaluation for depression which was done and recommended Remeron 7.5 mg at night. Patient stable for discharge.   Subjective: Patient seen and examined has no complaints on the day of discharge, legs snot at baseline yet but much improved. Denies chest pain, sob, palpitations, dizziness and abdominal pain. Tolerated diet well. Ambulating with PT.   Discharge Diagnoses/Hospital Course:  Principal Problem:   Acute encephalopathy Active Problems:  Generalized weakness   Diabetes mellitus without complication (HCC)   Mild renal insufficiency  Acute encephalopathy - Resolved - dehydration contributing to these symptoms, as well dementia. Family reports some signs of depression which might be contributing as well. ? Medication side effect from Cymbalta. I suspect some natural declining/deconditioning as well.  CT head and MRI -negative for acute findings Reversible causes of dementia negative.TSH normal, ammonia levels normal. UA w/o abnormalities Chest x-ray with no signs of infection. B12 normal. RPR negative.  She was treated with IVF and symptoms improved - PT recommended PT/OT Psych was consulted and recommended Remeron 7.5 mg at bedtime and outpatient follow up   Acute renal failure - contributing to weakness  Cr admission 1.27 >> 0.88 after hydration and stable  Check BMP in 1 week   DM type 2 CGB stable during hospital stay  Insulin was continue with no changes  Follow with PCP in 1 week   HTN  BP stable  Continue home medication - no changes in medication were made.   All other chronic medical condition were stable during the hospitalization.  Patient was seen by physical therapy, recommending home health PT/OT On the day of the discharge the patient's vitals were stable, and no other acute medical condition were reported by patient. Patient was felt safe to be discharge to home   Discharge Instructions  You were cared for by a hospitalist during your hospital stay. If you have any questions about your discharge medications or the care you received while you were in the hospital after you are discharged, you can call the unit and asked to speak with the hospitalist on call if the hospitalist that took care of you is not available. Once you are discharged, your primary  care physician will handle any further medical issues. Please note that NO REFILLS for any discharge medications will be authorized once you are discharged,  as it is imperative that you return to your primary care physician (or establish a relationship with a primary care physician if you do not have one) for your aftercare needs so that they can reassess your need for medications and monitor your lab values.  Discharge Instructions    Call MD for:  difficulty breathing, headache or visual disturbances    Complete by:  As directed    Call MD for:  extreme fatigue    Complete by:  As directed    Call MD for:  hives    Complete by:  As directed    Call MD for:  persistant dizziness or light-headedness    Complete by:  As directed    Call MD for:  persistant nausea and vomiting    Complete by:  As directed    Call MD for:  redness, tenderness, or signs of infection (pain, swelling, redness, odor or green/yellow discharge around incision site)    Complete by:  As directed    Call MD for:  severe uncontrolled pain    Complete by:  As directed    Call MD for:  temperature >100.4    Complete by:  As directed    Diet - low sodium heart healthy    Complete by:  As directed    Increase activity slowly    Complete by:  As directed      Allergies as of 01/12/2017      Reactions   Novocain [procaine] Other (See Comments)   Cardiologist told pt never to use novocain      Medication List    STOP taking these medications   DULoxetine 30 MG capsule Commonly known as:  CYMBALTA     TAKE these medications   acetaminophen 500 MG tablet Commonly known as:  TYLENOL Take 1,000 mg by mouth every 6 (six) hours as needed for headache (pain).   ARNICARE Gel Apply 1 application topically 2 (two) times daily as needed (pain).   aspirin EC 81 MG tablet Take 162 mg by mouth at bedtime.   atenolol 25 MG tablet Commonly known as:  TENORMIN Take 25 mg by mouth daily.   atorvastatin 80 MG tablet Commonly known as:  LIPITOR Take 80 mg by mouth at bedtime.   diazepam 5 MG tablet Commonly known as:  VALIUM Take 5 mg by mouth daily as needed for  anxiety.   LEVEMIR FLEXPEN 100 UNIT/ML Pen Generic drug:  Insulin Detemir Inject 40 Units into the skin 2 (two) times daily.   lidocaine 5 % Commonly known as:  LIDODERM Place 1 patch onto the skin daily as needed (pain). Leave on for 12 hours, then remove   losartan-hydrochlorothiazide 50-12.5 MG tablet Commonly known as:  HYZAAR Take 0.5 tablets by mouth daily.   NOVOLOG FLEXPEN 100 UNIT/ML FlexPen Generic drug:  insulin aspart Inject 26 Units into the skin 3 (three) times daily with meals.            Durable Medical Equipment        Start     Ordered   01/12/17 1417  For home use only DME Walker rolling  Once    Comments:  Youth rolling walker  Question:  Patient needs a walker to treat with the following condition  Answer:  Weakness   01/12/17 1417  Discharge Care Instructions        Start     Ordered   01/12/17 0000  Increase activity slowly     01/12/17 1628   01/12/17 0000  Diet - low sodium heart healthy     01/12/17 1628   01/12/17 0000  Call MD for:  temperature >100.4     01/12/17 1628   01/12/17 0000  Call MD for:  persistant nausea and vomiting     01/12/17 1628   01/12/17 0000  Call MD for:  severe uncontrolled pain     01/12/17 1628   01/12/17 0000  Call MD for:  redness, tenderness, or signs of infection (pain, swelling, redness, odor or green/yellow discharge around incision site)     01/12/17 1628   01/12/17 0000  Call MD for:  difficulty breathing, headache or visual disturbances     01/12/17 1628   01/12/17 0000  Call MD for:  hives     01/12/17 1628   01/12/17 0000  Call MD for:  persistant dizziness or light-headedness     01/12/17 1628   01/12/17 0000  Call MD for:  extreme fatigue     01/12/17 1628     Follow-up Information    Medicine, [facility]. Schedule an appointment as soon as possible for a visit in 1 week(s).   Specialty:  Internal Medicine Why:  Hospital follow up  Contact information: [address] [place] [address] [phone]          Allergies  Allergen Reactions  . Novocain [Procaine] Other (See Comments)    Cardiologist told pt never to use novocain    Consultations:  Psychiatry   Procedures/Studies: Dg Chest 2 View  Result Date: 01/10/2017 CLINICAL DATA:  Cough, confusion.  Poor historian. EXAM: CHEST  2 VIEW COMPARISON:  None. FINDINGS: Cardiomediastinal silhouette is normal. Calcified aortic knob. Mild interstitial prominence. No pleural effusions or focal consolidations. Trachea projects midline and there is no pneumothorax. Soft tissue planes and included osseous structures are non-suspicious. Osteopenia. IMPRESSION: Mild interstitial prominence, possibly chronic. No focal consolidation. Aortic Atherosclerosis (ICD10-I70.0). Electronically Signed   By: Elon Alas M.D.   On: 01/10/2017 16:12   Ct Head Wo Contrast  Result Date: 01/10/2017 CLINICAL DATA:  Increased confusion for couple weeks. Increasing confusion x a couple of weeks, with inability to stand, walk today; pt fell 2 days ago, striking RIGHT forehead on corner of bed; pt denies LOC EXAM: CT HEAD WITHOUT CONTRAST TECHNIQUE: Contiguous axial images were obtained from the base of the skull through the vertex without intravenous contrast. COMPARISON:  None. FINDINGS: Brain: No acute intracranial hemorrhage. No focal mass lesion. No CT evidence of acute infarction. No midline shift or mass effect. No hydrocephalus. Basilar cisterns are patent. Extensive confluent subcortical white matter hypodensity. Mild ventricular dilatation cortical atrophy. Vascular: No hyperdense vessel or unexpected calcification. Skull: Normal. Negative for fracture or focal lesion. Sinuses/Orbits: No acute finding. Other: None. IMPRESSION: 1. No acute intracranial findings. 2. Confluent white matter hypodensity favored small vessel ischemia. 3. Moderate atrophy and proportional ventricular dilatation. Electronically Signed   By:  Suzy Bouchard M.D.   On: 01/10/2017 16:12   Mr Brain Wo Contrast  Result Date: 01/11/2017 CLINICAL DATA:  Altered level of consciousness.  Diabetes. EXAM: MRI HEAD WITHOUT CONTRAST TECHNIQUE: Multiplanar, multiecho pulse sequences of the brain and surrounding structures were obtained without intravenous contrast. COMPARISON:  CT head 01/10/2017 FINDINGS: Brain: Mild atrophy. Negative for hydrocephalus. Extensive white  matter hyperintensity diffusely. Patchy hyperintensity in the thalamus and pons bilaterally. Negative for acute infarct, hemorrhage, mass Vascular: Normal arterial flow voids Skull and upper cervical spine: Negative Sinuses/Orbits: Negative Other: None IMPRESSION: Atrophy and extensive chronic microvascular ischemia. No acute abnormality. Electronically Signed   By: Franchot Gallo M.D.   On: 01/11/2017 15:14    Discharge Exam: Vitals:   01/12/17 0605 01/12/17 1011  BP: (!) 118/51 (!) 114/53  Pulse: 67 63  Resp: 17   Temp: 98.4 F (36.9 C)   SpO2: 94%    Vitals:   01/11/17 1520 01/11/17 2256 01/12/17 0605 01/12/17 1011  BP: (!) 113/41 (!) 97/51 (!) 118/51 (!) 114/53  Pulse: 64 63 67 63  Resp: 14 17 17    Temp: 98 F (36.7 C) 98.7 F (37.1 C) 98.4 F (36.9 C)   TempSrc: Oral Oral Oral   SpO2: 99%  94%   Weight:   68.2 kg (150 lb 5.7 oz)   Height:        General: Pt is alert, awake, not in acute distress Cardiovascular: RRR, S1/S2 +, no rubs, no gallops Respiratory: CTA bilaterally, no wheezing, no rhonchi Abdominal: Soft, NT, ND, bowel sounds + Extremities: no edema, no cyanosis  The results of significant diagnostics from this hospitalization (including imaging, microbiology, ancillary and laboratory) are listed below for reference.     Microbiology: No results found for this or any previous visit (from the past 240 hour(s)).   Labs: BNP (last 3 results) No results for input(s): BNP in the last 8760 hours. Basic Metabolic Panel:  Recent Labs Lab  01/10/17 1300 01/11/17 0545 01/12/17 0531  NA 131* 136 135  K 4.7 3.8 4.1  CL 96* 105 104  CO2 24 24 24   GLUCOSE 153* 112* 122*  BUN 40* 28* 22*  CREATININE 1.27* 0.88 0.88  CALCIUM 9.5 8.7* 8.9   Liver Function Tests: No results for input(s): AST, ALT, ALKPHOS, BILITOT, PROT, ALBUMIN in the last 168 hours. No results for input(s): LIPASE, AMYLASE in the last 168 hours.  Recent Labs Lab 01/10/17 2030  AMMONIA 25   CBC:  Recent Labs Lab 01/10/17 1300 01/11/17 0545  WBC 8.7 7.2  HGB 15.4* 13.2  HCT 44.1 38.5  MCV 93.4 93.7  PLT 189 146*   Cardiac Enzymes: No results for input(s): CKTOTAL, CKMB, CKMBINDEX, TROPONINI in the last 168 hours. BNP: Invalid input(s): POCBNP CBG:  Recent Labs Lab 01/11/17 1207 01/11/17 1815 01/11/17 2227 01/12/17 0838 01/12/17 1141  GLUCAP 179* 138* 148* 107* 184*   D-Dimer No results for input(s): DDIMER in the last 72 hours. Hgb A1c No results for input(s): HGBA1C in the last 72 hours. Lipid Profile No results for input(s): CHOL, HDL, LDLCALC, TRIG, CHOLHDL, LDLDIRECT in the last 72 hours. Thyroid function studies  Recent Labs  01/10/17 1631  TSH 2.228   Anemia work up  Recent Labs  01/10/17 2030  VITAMINB12 1,173*   Urinalysis    Component Value Date/Time   COLORURINE YELLOW 01/10/2017 1753   APPEARANCEUR HAZY (A) 01/10/2017 1753   LABSPEC 1.017 01/10/2017 1753   PHURINE 5.0 01/10/2017 1753   GLUCOSEU NEGATIVE 01/10/2017 1753   HGBUR NEGATIVE 01/10/2017 1753   BILIRUBINUR NEGATIVE 01/10/2017 1753   KETONESUR NEGATIVE 01/10/2017 1753   PROTEINUR NEGATIVE 01/10/2017 1753   NITRITE NEGATIVE 01/10/2017 1753   LEUKOCYTESUR NEGATIVE 01/10/2017 1753   Sepsis Labs Invalid input(s): PROCALCITONIN,  WBC,  LACTICIDVEN Microbiology No results found for this or any previous visit (  from the past 240 hour(s)).  Time coordinating discharge: 35 minutes  SIGNED:  Chipper Oman, MD  [facility] 01/12/2017, 4:28  PM  Pager please text page via  [email or web address] Password [email or web address]

## 2017-01-12 NOTE — Progress Notes (Signed)
Suzanne Willis to be D/C'd Home per MD order.  Discussed with the patient and all questions fully answered.  VSS, Skin clean, dry and intact without evidence of skin break down, no evidence of skin tears noted. IV catheter discontinued intact. Site without signs and symptoms of complications. Dressing and pressure applied.  An After Visit Summary was printed and given to the patient. Patient received prescription.  D/c education completed with patient/family including follow up instructions, medication list, d/c activities limitations if indicated, with other d/c instructions as indicated by MD - patient able to verbalize understanding, all questions fully answered.   Patient instructed to return to ED, call 911, or call MD for any changes in condition.   Patient escorted via [place], and D/C home via private auto.  Christoper Fabian Hitesh Fouche 01/12/2017 6:50 PM

## 2017-01-12 NOTE — Care Management Note (Signed)
Case Management Note  Patient Details  Name: Suzanne Willis MRN: [id number] Date of Birth: 02/16/1934  Subjective/Objective:           Admitted with acute encephalopathy, weakness. Hx of diabetes mellitus and hypertension.  From home with husband. Prior to admission pt independent with ADL's,  [name] (Spouse)     [phone]       PCP: [facility]  Action/Plan: Plan is to d/c to home when medically stable. Per PT's recommendation:Home health PT;Supervision for mobility/OOB;Supervision/Assistance - 24 hour.      Expected Discharge Date:  01/11/17               Expected Discharge Plan:  [place]  In-House Referral:     Discharge planning Services  CM Consult  Post Acute Care Choice:    Choice offered to:  Patient  DME Arranged:  [facility] youth DME Agency:  [place] Arranged:  PT [facility] Agency:   ([phone],referral made pending MD's order. CM has requested order from MD.  Home health order, d/c summary and demographic will need to be faxed to [phone] to process referral.  Status of Service:    If discussed at [place] of Stay Meetings, dates discussed:    Additional Comments:  Sharin Mons, RN 01/12/2017, 2:19 PM

## 2017-01-12 NOTE — Progress Notes (Signed)
Physical Therapy Treatment Patient Details Name: Suzanne Willis MRN: [id number] DOB: 1933-08-29 Today's Date: 01/12/2017    History of Present Illness Suzanne Willis is a 83 y.o. female with medical history significant for insulin-dependent diabetes mellitus and hypertension, now presenting to the emergency department for evaluation of generalized weakness and confusion. Pt also with recent falls per chart.    PT Comments    Pt performed increased gait and performed stairs for the first time since admission.  Pt presents with decreased safety awareness and requires assistance to improve safety with function.  Pt's family reports she has a walker at home without wheels but it is too tall for patient to ambulate safely.  With patient's history of falls she would benefit from a youth height  RW to improve safety and function this will also allow her to maintain proximity to RW with improved fit.  Educated family on sizing RW height at d/c.  Plan to return home with assistance from family remains appropriate.    Follow Up Recommendations  Home health PT;Supervision for mobility/OOB;Supervision/Assistance - 24 hour     Equipment Recommendations  Rolling walker with 5" wheels (youth height RW.  )    Recommendations for Other Services       Precautions / Restrictions Precautions Precautions: Fall Restrictions Weight Bearing Restrictions: No    Mobility  Bed Mobility               General bed mobility comments: Pt received in recliner on arrival.    Transfers Overall transfer level: Needs assistance Equipment used: Rolling walker (2 wheeled) Transfers: Sit to/from Stand Sit to Stand: Min guard         General transfer comment: cues for hand placement, pt impulsively stood without RW and reaching to window seal with L lateral lean/LOB.    Ambulation/Gait Ambulation/Gait assistance: Min assist Ambulation Distance (Feet): 150 Feet Assistive device: Rolling walker (2  wheeled) Gait Pattern/deviations: Decreased step length - right;Decreased step length - left;Trunk flexed;Drifts right/left;Decreased stride length Gait velocity: decreased Gait velocity interpretation: Below normal speed for age/gender General Gait Details: Constant cues to step closer within frame of RW, cues for negotiating obstacles in halls and cues for forward gaze.  Pt drifting during gait training and requiring constant cues for re-direction.     Stairs Stairs: Yes   Stair Management: One rail Right (HHA L.  ) Number of Stairs: 4 General stair comments: Pt required cues for sequencing and safety with hand placement.  Son present to observe stair training for carryover for assistance at home.  Pt has no rails at home but unable to follow commands to avoid using rail.  Encouraged son to use B HHA at home.  Pt performed reciprocal pattern to ascend and step to pattern to descend.    Wheelchair Mobility    Modified Rankin (Stroke Patients Only)       Balance Overall balance assessment: History of Falls;Needs assistance   Sitting balance-Leahy Scale: Fair       Standing balance-Leahy Scale: Poor Standing balance comment: requires UE support                            Cognition Arousal/Alertness: Awake/alert Behavior During Therapy: Impulsive Overall Cognitive Status: Impaired/Different from baseline Area of Impairment: Memory;Safety/judgement;Awareness;Following commands;Problem solving                     Memory: Decreased short-term memory Following Commands: Follows one  step commands inconsistently   Awareness: Intellectual Problem Solving: Slow processing;Requires verbal cues;Requires tactile cues;Difficulty sequencing        Exercises      General Comments        Pertinent Vitals/Pain Pain Assessment: No/denies pain    Home Living                      Prior Function            PT Goals (current goals can now be found  in the care plan section) Acute Rehab PT Goals Patient Stated Goal: return to walking like she was before. Potential to Achieve Goals: Good Progress towards PT goals: Progressing toward goals    Frequency    Min 3X/week      PT Plan Current plan remains appropriate    Co-evaluation              AM-PAC PT "6 Clicks" Daily Activity  Outcome Measure  Difficulty turning over in bed (including adjusting bedclothes, sheets and blankets)?: None Difficulty moving from lying on back to sitting on the side of the bed? : None Difficulty sitting down on and standing up from a chair with arms (e.g., wheelchair, bedside commode, etc,.)?: Unable Help needed moving to and from a bed to chair (including a wheelchair)?: A Little Help needed walking in hospital room?: A Little Help needed climbing 3-5 steps with a railing? : A Little 6 Click Score: 18    End of Session Equipment Utilized During Treatment: Gait belt Activity Tolerance: Patient tolerated treatment well Patient left: in chair;with call bell/phone within reach;with chair alarm set Nurse Communication: Mobility status PT Visit Diagnosis: Unsteadiness on feet (R26.81);Difficulty in walking, not elsewhere classified (R26.2);Muscle weakness (generalized) (M62.81)     Time: 2952-8413 PT Time Calculation (min) (ACUTE ONLY): 25 min  Charges:  $Gait Training: 8-22 mins $Therapeutic Activity: 8-22 mins                    G Codes:       Governor Rooks, PTA pager [phone]    Cristela Blue 01/12/2017, 12:11 PM

## 2017-01-12 NOTE — Consult Note (Signed)
           [facility] CM Primary Care Navigator  01/12/2017  Reshanda Lewey 05-12-1933 [id number]    Went to seepatient earlierto identify possible discharge needs but she was being evaluated by a psychologist in the room according to two family members standing outside her door.  Made family members aware that I will be back to meet with patient once available in the room.    Addendum:  Went back to seepatient at the bedsideto identify possible discharge needs but she was already discharged per RN report.   Patient was just discharged home today.  Noted that patient has discharge instruction to follow-up with primary care provider in a week and check labs- BMP/ CBC to monitor renal function and hemoglobin.    For questions, please contact:  Dannielle Huh, BSN, RN- [facility] Primary Care Navigator  Telephone: [phone] [place]

## 2017-01-13 DIAGNOSIS — F0631 Mood disorder due to known physiological condition with depressive features: Secondary | ICD-10-CM | POA: Diagnosis present

## 2017-01-13 LAB — VITAMIN D 25 HYDROXY (VIT D DEFICIENCY, FRACTURES): Vit D, 25-Hydroxy: 17.1 ng/mL — ABNORMAL LOW (ref 30.0–100.0)

## 2017-01-13 LAB — FOLATE RBC
FOLATE, HEMOLYSATE: 288.9 ng/mL
FOLATE, RBC: 693 ng/mL (ref >498)
HEMATOCRIT: 41.7 % (ref 34.0–46.6)

## 2017-01-13 NOTE — Consult Note (Signed)
[facility] Consult   Reason for Consult:  depression  Referring Physician:  EDP Patient Identification: Suzanne Willis MRN:  [id number] Principal Diagnosis: Depression due to physical illness Diagnosis:   Patient Active Problem List   Diagnosis Date Noted  . Depression due to physical illness [F06.31] 01/13/2017    Priority: High  . Acute encephalopathy [G93.40] 01/10/2017  . Generalized weakness [R53.1] 01/10/2017  . Diabetes mellitus without complication ([place]) [W41.3] 01/10/2017  . Mild renal insufficiency [N28.9] 01/10/2017    Total Time spent with patient: 55 minutes  Subjective:   Suzanne Willis is a 83 y.o. female patient does not warrant admission.  HPI:  83 yo female who came to the hospital for medical issues with family concerns about depression.  The patient has been sleeping more after Cymbalta was started and eating less, fifteen pound weight loss.  This medication was stopped.  The patient has had a significant change in activity due to her recent weakness and which had decreased her quality of life.  She also is grieving the death of a close cousin.  Patient denies suicidal/homicidal ideations, hallucinations, and substance abuse.  Her family has no safety concerns.  After meeting with the patient, this provider met with the family and patient together per the patient's request.  It was agreed that she would go to rehab to regain her strength and the family would look into geriatric day programs for nutrition classes and socialization as the patient feels "lonely" at times and would like more social engagements.  Medications were discussed and Remeron 7.5 mg at bedtime was agreed with to start to help regulate her sleep and increase her appetite.  The family will make a psychiatric appointment tomorrow during business hours, provider numbers given.    Past Psychiatric History: depression  Risk to Self: Is patient at risk for suicide?: No Risk to Others:   None Prior Inpatient Therapy:  None Prior Outpatient Therapy:  None  Past Medical History:  Past Medical History:  Diagnosis Date  . Diabetes mellitus without complication ([place])   . History of angina     Past Surgical History:  Procedure Laterality Date  . ANGIOPLASTY    . JOINT REPLACEMENT     knee replacement  . ROTATOR CUFF REPAIR Right    Family History:  Family History  Problem Relation Age of Onset  . Obesity Daughter    Family Psychiatric  History: none  Social History:  History  Alcohol Use No     History  Drug Use No    Social History   Social History  . Marital status: Married    Spouse name: N/A  . Number of children: N/A  . Years of education: N/A   Social History Main Topics  . Smoking status: Never Smoker  . Smokeless tobacco: Never Used  . Alcohol use No  . Drug use: No  . Sexual activity: Not Asked   Other Topics Concern  . None   Social History Narrative  . None   Additional Social History:    Allergies:   Allergies  Allergen Reactions  . Novocain [Procaine] Other (See Comments)    Cardiologist told pt never to use novocain    Labs:  Results for orders placed or performed during the hospital encounter of 01/10/17 (from the past 48 hour(s))  Glucose, capillary     Status: Abnormal   Collection Time: 01/11/17  8:42 AM  Result Value Ref Range   Glucose-Capillary 109 (H) 65 -  99 mg/dL  Glucose, capillary     Status: Abnormal   Collection Time: 01/11/17 12:07 PM  Result Value Ref Range   Glucose-Capillary 179 (H) 65 - 99 mg/dL   Comment 1 QC Due   Glucose, capillary     Status: Abnormal   Collection Time: 01/11/17  6:15 PM  Result Value Ref Range   Glucose-Capillary 138 (H) 65 - 99 mg/dL  Glucose, capillary     Status: Abnormal   Collection Time: 01/11/17 10:27 PM  Result Value Ref Range   Glucose-Capillary 148 (H) 65 - 99 mg/dL  VITAMIN D 25 Hydroxy (Vit-D Deficiency, Fractures)     Status: Abnormal   Collection Time:  01/12/17  5:31 AM  Result Value Ref Range   Vit D, 25-Hydroxy 17.1 (L) 30.0 - 100.0 ng/mL    Comment: (NOTE) Vitamin D deficiency has been defined by the Institute of Medicine and an Endocrine Society practice guideline as a level of serum 25-OH vitamin D less than 20 ng/mL (1,2). The Endocrine Society went on to further define vitamin D insufficiency as a level between 21 and 29 ng/mL (2). 1. IOM (Institute of Medicine). 2010. Dietary reference   intakes for calcium and D. [place]: The   [employer]. 2. Holick MF, Binkley Rye, Bischoff-Ferrari HA, et al.   Evaluation, treatment, and prevention of vitamin D   deficiency: an Endocrine Society clinical practice   guideline. JCEM. 2011 Jul; 96(7):1911-30. Performed At: [facility] [place], [place] [id number] Lindon Romp MD EX:[id number]   Basic metabolic panel     Status: Abnormal   Collection Time: 01/12/17  5:31 AM  Result Value Ref Range   Sodium 135 135 - 145 mmol/L   Potassium 4.1 3.5 - 5.1 mmol/L    Comment: SLIGHT HEMOLYSIS   Chloride 104 101 - 111 mmol/L   CO2 24 22 - 32 mmol/L   Glucose, Bld 122 (H) 65 - 99 mg/dL   BUN 22 (H) 6 - 20 mg/dL   Creatinine, Ser 0.88 0.44 - 1.00 mg/dL   Calcium 8.9 8.9 - 10.3 mg/dL   GFR calc non Af Amer 60 (L) >60 mL/min   GFR calc Af Amer >60 >60 mL/min    Comment: (NOTE) The eGFR has been calculated using the CKD EPI equation. This calculation has not been validated in all clinical situations. eGFR's persistently <60 mL/min signify possible Chronic Kidney Disease.    Anion gap 7 5 - 15  Glucose, capillary     Status: Abnormal   Collection Time: 01/12/17  8:38 AM  Result Value Ref Range   Glucose-Capillary 107 (H) 65 - 99 mg/dL  Glucose, capillary     Status: Abnormal   Collection Time: 01/12/17 11:41 AM  Result Value Ref Range   Glucose-Capillary 184 (H) 65 - 99 mg/dL  Glucose, capillary     Status: Abnormal   Collection Time:  01/12/17  5:34 PM  Result Value Ref Range   Glucose-Capillary 182 (H) 65 - 99 mg/dL    No current facility-administered medications for this encounter.    Current Outpatient Prescriptions  Medication Sig Dispense Refill  . acetaminophen (TYLENOL) 500 MG tablet Take 1,000 mg by mouth every 6 (six) hours as needed for headache (pain).    Marland Kitchen aspirin EC 81 MG tablet Take 162 mg by mouth at bedtime.    Marland Kitchen atenolol (TENORMIN) 25 MG tablet Take 25 mg by mouth daily.    . diazepam (VALIUM) 5  MG tablet Take 5 mg by mouth daily as needed for anxiety.    . Homeopathic Products (ARNICARE) GEL Apply 1 application topically 2 (two) times daily as needed (pain).    . insulin aspart (NOVOLOG FLEXPEN) 100 UNIT/ML FlexPen Inject 26 Units into the skin 3 (three) times daily with meals.    . Insulin Detemir (LEVEMIR FLEXPEN) 100 UNIT/ML Pen Inject 40 Units into the skin 2 (two) times daily.    Marland Kitchen lidocaine (LIDODERM) 5 % Place 1 patch onto the skin daily as needed (pain). Leave on for 12 hours, then remove    . losartan-hydrochlorothiazide (HYZAAR) 50-12.5 MG tablet Take 0.5 tablets by mouth daily.    Marland Kitchen atorvastatin (LIPITOR) 80 MG tablet Take 80 mg by mouth at bedtime.    . feeding supplement, GLUCERNA SHAKE, (GLUCERNA SHAKE) LIQD Take 237 mLs by mouth 3 (three) times daily between meals. 237 mL 20  . mirtazapine (REMERON) 15 MG tablet Take 0.5 tablets (7.5 mg total) by mouth at bedtime. 15 tablet 0    Musculoskeletal: Strength & Muscle Tone: decreased Gait & Station: unsteady Patient leans: N/A  Psychiatric Specialty Exam: Physical Exam  Constitutional: She is oriented to person, place, and time. She appears well-developed and well-nourished.  HENT:  Head: Normocephalic.  Neck: Normal range of motion.  Musculoskeletal: Normal range of motion.  Neurological: She is alert and oriented to person, place, and time.  Psychiatric: Her speech is normal and behavior is normal. Judgment and thought content  normal. Cognition and memory are normal. She exhibits a depressed mood.    Review of Systems  Psychiatric/Behavioral: Positive for depression.  All other systems reviewed and are negative.   Blood pressure (!) 108/51, pulse 74, temperature 100.1 F (37.8 C), temperature source Oral, resp. rate 14, height 5' 4"  (1.626 m), weight 68.2 kg (150 lb 5.7 oz), SpO2 97 %.Body mass index is 25.81 kg/m.  General Appearance: Casual  Eye Contact:  Good  Speech:  Normal Rate  Volume:  Normal  Mood:  Depressed, mild  Affect:  Congruent  Thought Process:  Coherent and Descriptions of Associations: Intact  Orientation:  Full (Time, Place, and Person)  Thought Content:  WDL and Logical  Suicidal Thoughts:  No  Homicidal Thoughts:  No  Memory:  Immediate;   Good Recent;   Good Remote;   Good  Judgement:  Good  Insight:  Good  Psychomotor Activity:  Decreased  Concentration:  Concentration: Good and Attention Span: Good  Recall:  Good  Fund of Knowledge:  Good  Language:  Good  Akathisia:  No  Handed:  Right  AIMS (if indicated):     Assets:  Communication Skills Desire for Improvement Financial Resources/Insurance Housing Intimacy Leisure Time Physical Health Resilience Social Support Talents/Skills Transportation  ADL's:  Intact  Cognition:  WNL  Sleep:        Treatment Plan Summary: Daily contact with patient to assess and evaluate symptoms and progress in treatment, Medication management and Plan major depressive disorder, recurrent, mild:  -Outpatient resources provided -Recommended Remeron 7.5 mg at bedtime for sleep and appetite -Dr. Dwyane Dee reviewed the patient and concurs with the findings.  Disposition: No evidence of imminent risk to self or others at present.    Waylan Boga, NP 01/13/2017 8:26 AM

## 2017-01-19 DIAGNOSIS — G9349 Other encephalopathy: Secondary | ICD-10-CM | POA: Diagnosis not present

## 2017-01-19 DIAGNOSIS — F331 Major depressive disorder, recurrent, moderate: Secondary | ICD-10-CM | POA: Diagnosis not present

## 2017-01-19 DIAGNOSIS — F411 Generalized anxiety disorder: Secondary | ICD-10-CM | POA: Diagnosis not present

## 2017-01-22 DIAGNOSIS — F329 Major depressive disorder, single episode, unspecified: Secondary | ICD-10-CM | POA: Diagnosis not present

## 2017-01-22 DIAGNOSIS — I10 Essential (primary) hypertension: Secondary | ICD-10-CM | POA: Diagnosis not present

## 2017-01-22 DIAGNOSIS — M6281 Muscle weakness (generalized): Secondary | ICD-10-CM | POA: Diagnosis not present

## 2017-01-22 DIAGNOSIS — Z794 Long term (current) use of insulin: Secondary | ICD-10-CM | POA: Diagnosis not present

## 2017-01-22 DIAGNOSIS — Z7982 Long term (current) use of aspirin: Secondary | ICD-10-CM | POA: Diagnosis not present

## 2017-01-22 DIAGNOSIS — E119 Type 2 diabetes mellitus without complications: Secondary | ICD-10-CM | POA: Diagnosis not present

## 2017-01-26 DIAGNOSIS — F329 Major depressive disorder, single episode, unspecified: Secondary | ICD-10-CM | POA: Diagnosis not present

## 2017-01-26 DIAGNOSIS — E119 Type 2 diabetes mellitus without complications: Secondary | ICD-10-CM | POA: Diagnosis not present

## 2017-01-26 DIAGNOSIS — I10 Essential (primary) hypertension: Secondary | ICD-10-CM | POA: Diagnosis not present

## 2017-01-26 DIAGNOSIS — Z7982 Long term (current) use of aspirin: Secondary | ICD-10-CM | POA: Diagnosis not present

## 2017-01-26 DIAGNOSIS — M6281 Muscle weakness (generalized): Secondary | ICD-10-CM | POA: Diagnosis not present

## 2017-01-26 DIAGNOSIS — Z794 Long term (current) use of insulin: Secondary | ICD-10-CM | POA: Diagnosis not present

## 2017-01-30 DIAGNOSIS — I10 Essential (primary) hypertension: Secondary | ICD-10-CM | POA: Diagnosis not present

## 2017-01-30 DIAGNOSIS — Z794 Long term (current) use of insulin: Secondary | ICD-10-CM | POA: Diagnosis not present

## 2017-01-30 DIAGNOSIS — M6281 Muscle weakness (generalized): Secondary | ICD-10-CM | POA: Diagnosis not present

## 2017-01-30 DIAGNOSIS — E119 Type 2 diabetes mellitus without complications: Secondary | ICD-10-CM | POA: Diagnosis not present

## 2017-01-30 DIAGNOSIS — Z7982 Long term (current) use of aspirin: Secondary | ICD-10-CM | POA: Diagnosis not present

## 2017-01-30 DIAGNOSIS — F329 Major depressive disorder, single episode, unspecified: Secondary | ICD-10-CM | POA: Diagnosis not present

## 2017-01-31 DIAGNOSIS — I10 Essential (primary) hypertension: Secondary | ICD-10-CM | POA: Diagnosis not present

## 2017-01-31 DIAGNOSIS — F329 Major depressive disorder, single episode, unspecified: Secondary | ICD-10-CM | POA: Diagnosis not present

## 2017-01-31 DIAGNOSIS — E119 Type 2 diabetes mellitus without complications: Secondary | ICD-10-CM | POA: Diagnosis not present

## 2017-01-31 DIAGNOSIS — Z794 Long term (current) use of insulin: Secondary | ICD-10-CM | POA: Diagnosis not present

## 2017-01-31 DIAGNOSIS — M6281 Muscle weakness (generalized): Secondary | ICD-10-CM | POA: Diagnosis not present

## 2017-01-31 DIAGNOSIS — Z7982 Long term (current) use of aspirin: Secondary | ICD-10-CM | POA: Diagnosis not present

## 2017-02-03 DIAGNOSIS — M6281 Muscle weakness (generalized): Secondary | ICD-10-CM | POA: Diagnosis not present

## 2017-02-03 DIAGNOSIS — E1122 Type 2 diabetes mellitus with diabetic chronic kidney disease: Secondary | ICD-10-CM | POA: Diagnosis not present

## 2017-02-03 DIAGNOSIS — F3289 Other specified depressive episodes: Secondary | ICD-10-CM | POA: Diagnosis not present

## 2017-02-03 DIAGNOSIS — Z1389 Encounter for screening for other disorder: Secondary | ICD-10-CM | POA: Diagnosis not present

## 2017-02-03 DIAGNOSIS — E119 Type 2 diabetes mellitus without complications: Secondary | ICD-10-CM | POA: Diagnosis not present

## 2017-02-03 DIAGNOSIS — Z7982 Long term (current) use of aspirin: Secondary | ICD-10-CM | POA: Diagnosis not present

## 2017-02-03 DIAGNOSIS — I10 Essential (primary) hypertension: Secondary | ICD-10-CM | POA: Diagnosis not present

## 2017-02-03 DIAGNOSIS — E7849 Other hyperlipidemia: Secondary | ICD-10-CM | POA: Diagnosis not present

## 2017-02-03 DIAGNOSIS — Z0000 Encounter for general adult medical examination without abnormal findings: Secondary | ICD-10-CM | POA: Diagnosis not present

## 2017-02-03 DIAGNOSIS — Z794 Long term (current) use of insulin: Secondary | ICD-10-CM | POA: Diagnosis not present

## 2017-02-03 DIAGNOSIS — F329 Major depressive disorder, single episode, unspecified: Secondary | ICD-10-CM | POA: Diagnosis not present

## 2017-02-04 DIAGNOSIS — M6281 Muscle weakness (generalized): Secondary | ICD-10-CM | POA: Diagnosis not present

## 2017-02-04 DIAGNOSIS — F329 Major depressive disorder, single episode, unspecified: Secondary | ICD-10-CM | POA: Diagnosis not present

## 2017-02-04 DIAGNOSIS — I10 Essential (primary) hypertension: Secondary | ICD-10-CM | POA: Diagnosis not present

## 2017-02-04 DIAGNOSIS — Z7982 Long term (current) use of aspirin: Secondary | ICD-10-CM | POA: Diagnosis not present

## 2017-02-04 DIAGNOSIS — E119 Type 2 diabetes mellitus without complications: Secondary | ICD-10-CM | POA: Diagnosis not present

## 2017-02-04 DIAGNOSIS — Z794 Long term (current) use of insulin: Secondary | ICD-10-CM | POA: Diagnosis not present

## 2017-02-05 DIAGNOSIS — M6281 Muscle weakness (generalized): Secondary | ICD-10-CM | POA: Diagnosis not present

## 2017-02-05 DIAGNOSIS — E119 Type 2 diabetes mellitus without complications: Secondary | ICD-10-CM | POA: Diagnosis not present

## 2017-02-05 DIAGNOSIS — Z794 Long term (current) use of insulin: Secondary | ICD-10-CM | POA: Diagnosis not present

## 2017-02-05 DIAGNOSIS — Z7982 Long term (current) use of aspirin: Secondary | ICD-10-CM | POA: Diagnosis not present

## 2017-02-05 DIAGNOSIS — F329 Major depressive disorder, single episode, unspecified: Secondary | ICD-10-CM | POA: Diagnosis not present

## 2017-02-05 DIAGNOSIS — I10 Essential (primary) hypertension: Secondary | ICD-10-CM | POA: Diagnosis not present

## 2017-02-10 DIAGNOSIS — Z7982 Long term (current) use of aspirin: Secondary | ICD-10-CM | POA: Diagnosis not present

## 2017-02-10 DIAGNOSIS — I10 Essential (primary) hypertension: Secondary | ICD-10-CM | POA: Diagnosis not present

## 2017-02-10 DIAGNOSIS — Z794 Long term (current) use of insulin: Secondary | ICD-10-CM | POA: Diagnosis not present

## 2017-02-10 DIAGNOSIS — F329 Major depressive disorder, single episode, unspecified: Secondary | ICD-10-CM | POA: Diagnosis not present

## 2017-02-10 DIAGNOSIS — E119 Type 2 diabetes mellitus without complications: Secondary | ICD-10-CM | POA: Diagnosis not present

## 2017-02-10 DIAGNOSIS — M6281 Muscle weakness (generalized): Secondary | ICD-10-CM | POA: Diagnosis not present

## 2017-02-11 DIAGNOSIS — F329 Major depressive disorder, single episode, unspecified: Secondary | ICD-10-CM | POA: Diagnosis not present

## 2017-02-11 DIAGNOSIS — I10 Essential (primary) hypertension: Secondary | ICD-10-CM | POA: Diagnosis not present

## 2017-02-11 DIAGNOSIS — E119 Type 2 diabetes mellitus without complications: Secondary | ICD-10-CM | POA: Diagnosis not present

## 2017-02-11 DIAGNOSIS — Z7982 Long term (current) use of aspirin: Secondary | ICD-10-CM | POA: Diagnosis not present

## 2017-02-11 DIAGNOSIS — M6281 Muscle weakness (generalized): Secondary | ICD-10-CM | POA: Diagnosis not present

## 2017-02-11 DIAGNOSIS — Z794 Long term (current) use of insulin: Secondary | ICD-10-CM | POA: Diagnosis not present

## 2017-02-12 DIAGNOSIS — F329 Major depressive disorder, single episode, unspecified: Secondary | ICD-10-CM | POA: Diagnosis not present

## 2017-02-12 DIAGNOSIS — E119 Type 2 diabetes mellitus without complications: Secondary | ICD-10-CM | POA: Diagnosis not present

## 2017-02-12 DIAGNOSIS — Z794 Long term (current) use of insulin: Secondary | ICD-10-CM | POA: Diagnosis not present

## 2017-02-12 DIAGNOSIS — M6281 Muscle weakness (generalized): Secondary | ICD-10-CM | POA: Diagnosis not present

## 2017-02-12 DIAGNOSIS — I10 Essential (primary) hypertension: Secondary | ICD-10-CM | POA: Diagnosis not present

## 2017-02-12 DIAGNOSIS — Z7982 Long term (current) use of aspirin: Secondary | ICD-10-CM | POA: Diagnosis not present

## 2017-02-16 DIAGNOSIS — I10 Essential (primary) hypertension: Secondary | ICD-10-CM | POA: Diagnosis not present

## 2017-02-16 DIAGNOSIS — E119 Type 2 diabetes mellitus without complications: Secondary | ICD-10-CM | POA: Diagnosis not present

## 2017-02-16 DIAGNOSIS — M6281 Muscle weakness (generalized): Secondary | ICD-10-CM | POA: Diagnosis not present

## 2017-02-16 DIAGNOSIS — Z7982 Long term (current) use of aspirin: Secondary | ICD-10-CM | POA: Diagnosis not present

## 2017-02-16 DIAGNOSIS — F329 Major depressive disorder, single episode, unspecified: Secondary | ICD-10-CM | POA: Diagnosis not present

## 2017-02-16 DIAGNOSIS — Z794 Long term (current) use of insulin: Secondary | ICD-10-CM | POA: Diagnosis not present

## 2017-02-18 DIAGNOSIS — M6281 Muscle weakness (generalized): Secondary | ICD-10-CM | POA: Diagnosis not present

## 2017-02-18 DIAGNOSIS — I10 Essential (primary) hypertension: Secondary | ICD-10-CM | POA: Diagnosis not present

## 2017-02-18 DIAGNOSIS — E119 Type 2 diabetes mellitus without complications: Secondary | ICD-10-CM | POA: Diagnosis not present

## 2017-02-18 DIAGNOSIS — F329 Major depressive disorder, single episode, unspecified: Secondary | ICD-10-CM | POA: Diagnosis not present

## 2017-02-18 DIAGNOSIS — Z7982 Long term (current) use of aspirin: Secondary | ICD-10-CM | POA: Diagnosis not present

## 2017-02-18 DIAGNOSIS — Z794 Long term (current) use of insulin: Secondary | ICD-10-CM | POA: Diagnosis not present

## 2017-02-19 DIAGNOSIS — Z7982 Long term (current) use of aspirin: Secondary | ICD-10-CM | POA: Diagnosis not present

## 2017-02-19 DIAGNOSIS — M6281 Muscle weakness (generalized): Secondary | ICD-10-CM | POA: Diagnosis not present

## 2017-02-19 DIAGNOSIS — F329 Major depressive disorder, single episode, unspecified: Secondary | ICD-10-CM | POA: Diagnosis not present

## 2017-02-19 DIAGNOSIS — Z794 Long term (current) use of insulin: Secondary | ICD-10-CM | POA: Diagnosis not present

## 2017-02-19 DIAGNOSIS — I10 Essential (primary) hypertension: Secondary | ICD-10-CM | POA: Diagnosis not present

## 2017-02-19 DIAGNOSIS — E119 Type 2 diabetes mellitus without complications: Secondary | ICD-10-CM | POA: Diagnosis not present

## 2017-02-23 DIAGNOSIS — F329 Major depressive disorder, single episode, unspecified: Secondary | ICD-10-CM | POA: Diagnosis not present

## 2017-02-23 DIAGNOSIS — M6281 Muscle weakness (generalized): Secondary | ICD-10-CM | POA: Diagnosis not present

## 2017-02-23 DIAGNOSIS — I10 Essential (primary) hypertension: Secondary | ICD-10-CM | POA: Diagnosis not present

## 2017-02-23 DIAGNOSIS — Z7982 Long term (current) use of aspirin: Secondary | ICD-10-CM | POA: Diagnosis not present

## 2017-02-23 DIAGNOSIS — E119 Type 2 diabetes mellitus without complications: Secondary | ICD-10-CM | POA: Diagnosis not present

## 2017-02-23 DIAGNOSIS — Z794 Long term (current) use of insulin: Secondary | ICD-10-CM | POA: Diagnosis not present

## 2017-02-27 DIAGNOSIS — M6281 Muscle weakness (generalized): Secondary | ICD-10-CM | POA: Diagnosis not present

## 2017-02-27 DIAGNOSIS — E119 Type 2 diabetes mellitus without complications: Secondary | ICD-10-CM | POA: Diagnosis not present

## 2017-02-27 DIAGNOSIS — I10 Essential (primary) hypertension: Secondary | ICD-10-CM | POA: Diagnosis not present

## 2017-02-27 DIAGNOSIS — Z794 Long term (current) use of insulin: Secondary | ICD-10-CM | POA: Diagnosis not present

## 2017-02-27 DIAGNOSIS — F329 Major depressive disorder, single episode, unspecified: Secondary | ICD-10-CM | POA: Diagnosis not present

## 2017-02-27 DIAGNOSIS — Z7982 Long term (current) use of aspirin: Secondary | ICD-10-CM | POA: Diagnosis not present

## 2017-03-02 DIAGNOSIS — E119 Type 2 diabetes mellitus without complications: Secondary | ICD-10-CM | POA: Diagnosis not present

## 2017-03-02 DIAGNOSIS — Z7982 Long term (current) use of aspirin: Secondary | ICD-10-CM | POA: Diagnosis not present

## 2017-03-02 DIAGNOSIS — M6281 Muscle weakness (generalized): Secondary | ICD-10-CM | POA: Diagnosis not present

## 2017-03-02 DIAGNOSIS — I10 Essential (primary) hypertension: Secondary | ICD-10-CM | POA: Diagnosis not present

## 2017-03-02 DIAGNOSIS — F329 Major depressive disorder, single episode, unspecified: Secondary | ICD-10-CM | POA: Diagnosis not present

## 2017-03-02 DIAGNOSIS — Z794 Long term (current) use of insulin: Secondary | ICD-10-CM | POA: Diagnosis not present

## 2017-03-04 DIAGNOSIS — Z794 Long term (current) use of insulin: Secondary | ICD-10-CM | POA: Diagnosis not present

## 2017-03-04 DIAGNOSIS — M6281 Muscle weakness (generalized): Secondary | ICD-10-CM | POA: Diagnosis not present

## 2017-03-04 DIAGNOSIS — F329 Major depressive disorder, single episode, unspecified: Secondary | ICD-10-CM | POA: Diagnosis not present

## 2017-03-04 DIAGNOSIS — I10 Essential (primary) hypertension: Secondary | ICD-10-CM | POA: Diagnosis not present

## 2017-03-04 DIAGNOSIS — Z7982 Long term (current) use of aspirin: Secondary | ICD-10-CM | POA: Diagnosis not present

## 2017-03-04 DIAGNOSIS — E119 Type 2 diabetes mellitus without complications: Secondary | ICD-10-CM | POA: Diagnosis not present

## 2017-03-05 DIAGNOSIS — Z794 Long term (current) use of insulin: Secondary | ICD-10-CM | POA: Diagnosis not present

## 2017-03-05 DIAGNOSIS — M6281 Muscle weakness (generalized): Secondary | ICD-10-CM | POA: Diagnosis not present

## 2017-03-05 DIAGNOSIS — Z7982 Long term (current) use of aspirin: Secondary | ICD-10-CM | POA: Diagnosis not present

## 2017-03-05 DIAGNOSIS — F329 Major depressive disorder, single episode, unspecified: Secondary | ICD-10-CM | POA: Diagnosis not present

## 2017-03-05 DIAGNOSIS — I10 Essential (primary) hypertension: Secondary | ICD-10-CM | POA: Diagnosis not present

## 2017-03-05 DIAGNOSIS — E119 Type 2 diabetes mellitus without complications: Secondary | ICD-10-CM | POA: Diagnosis not present

## 2017-03-06 DIAGNOSIS — L278 Dermatitis due to other substances taken internally: Secondary | ICD-10-CM | POA: Diagnosis not present

## 2017-03-10 DIAGNOSIS — Z7982 Long term (current) use of aspirin: Secondary | ICD-10-CM | POA: Diagnosis not present

## 2017-03-10 DIAGNOSIS — E119 Type 2 diabetes mellitus without complications: Secondary | ICD-10-CM | POA: Diagnosis not present

## 2017-03-10 DIAGNOSIS — I10 Essential (primary) hypertension: Secondary | ICD-10-CM | POA: Diagnosis not present

## 2017-03-10 DIAGNOSIS — M6281 Muscle weakness (generalized): Secondary | ICD-10-CM | POA: Diagnosis not present

## 2017-03-10 DIAGNOSIS — F329 Major depressive disorder, single episode, unspecified: Secondary | ICD-10-CM | POA: Diagnosis not present

## 2017-03-10 DIAGNOSIS — Z794 Long term (current) use of insulin: Secondary | ICD-10-CM | POA: Diagnosis not present

## 2017-03-11 DIAGNOSIS — Z7982 Long term (current) use of aspirin: Secondary | ICD-10-CM | POA: Diagnosis not present

## 2017-03-11 DIAGNOSIS — F329 Major depressive disorder, single episode, unspecified: Secondary | ICD-10-CM | POA: Diagnosis not present

## 2017-03-11 DIAGNOSIS — I10 Essential (primary) hypertension: Secondary | ICD-10-CM | POA: Diagnosis not present

## 2017-03-11 DIAGNOSIS — M6281 Muscle weakness (generalized): Secondary | ICD-10-CM | POA: Diagnosis not present

## 2017-03-11 DIAGNOSIS — Z794 Long term (current) use of insulin: Secondary | ICD-10-CM | POA: Diagnosis not present

## 2017-03-11 DIAGNOSIS — E119 Type 2 diabetes mellitus without complications: Secondary | ICD-10-CM | POA: Diagnosis not present

## 2017-03-17 DIAGNOSIS — M6281 Muscle weakness (generalized): Secondary | ICD-10-CM | POA: Diagnosis not present

## 2017-03-17 DIAGNOSIS — F329 Major depressive disorder, single episode, unspecified: Secondary | ICD-10-CM | POA: Diagnosis not present

## 2017-03-17 DIAGNOSIS — I10 Essential (primary) hypertension: Secondary | ICD-10-CM | POA: Diagnosis not present

## 2017-03-17 DIAGNOSIS — E119 Type 2 diabetes mellitus without complications: Secondary | ICD-10-CM | POA: Diagnosis not present

## 2017-03-17 DIAGNOSIS — Z794 Long term (current) use of insulin: Secondary | ICD-10-CM | POA: Diagnosis not present

## 2017-03-17 DIAGNOSIS — Z7982 Long term (current) use of aspirin: Secondary | ICD-10-CM | POA: Diagnosis not present

## 2017-03-19 DIAGNOSIS — I10 Essential (primary) hypertension: Secondary | ICD-10-CM | POA: Diagnosis not present

## 2017-03-19 DIAGNOSIS — Z7982 Long term (current) use of aspirin: Secondary | ICD-10-CM | POA: Diagnosis not present

## 2017-03-19 DIAGNOSIS — E119 Type 2 diabetes mellitus without complications: Secondary | ICD-10-CM | POA: Diagnosis not present

## 2017-03-19 DIAGNOSIS — M6281 Muscle weakness (generalized): Secondary | ICD-10-CM | POA: Diagnosis not present

## 2017-03-19 DIAGNOSIS — F329 Major depressive disorder, single episode, unspecified: Secondary | ICD-10-CM | POA: Diagnosis not present

## 2017-03-19 DIAGNOSIS — Z794 Long term (current) use of insulin: Secondary | ICD-10-CM | POA: Diagnosis not present

## 2017-04-06 DIAGNOSIS — E1142 Type 2 diabetes mellitus with diabetic polyneuropathy: Secondary | ICD-10-CM | POA: Diagnosis not present

## 2017-04-06 DIAGNOSIS — I10 Essential (primary) hypertension: Secondary | ICD-10-CM | POA: Diagnosis not present

## 2017-04-06 DIAGNOSIS — E7849 Other hyperlipidemia: Secondary | ICD-10-CM | POA: Diagnosis not present

## 2017-05-06 DIAGNOSIS — E1122 Type 2 diabetes mellitus with diabetic chronic kidney disease: Secondary | ICD-10-CM | POA: Diagnosis not present

## 2017-05-06 DIAGNOSIS — I951 Orthostatic hypotension: Secondary | ICD-10-CM | POA: Diagnosis not present

## 2017-05-29 DIAGNOSIS — I951 Orthostatic hypotension: Secondary | ICD-10-CM | POA: Diagnosis not present

## 2017-05-29 DIAGNOSIS — E1122 Type 2 diabetes mellitus with diabetic chronic kidney disease: Secondary | ICD-10-CM | POA: Diagnosis not present

## 2017-06-09 DIAGNOSIS — R296 Repeated falls: Secondary | ICD-10-CM | POA: Diagnosis not present

## 2017-06-09 DIAGNOSIS — R2681 Unsteadiness on feet: Secondary | ICD-10-CM | POA: Diagnosis not present

## 2017-06-09 DIAGNOSIS — R262 Difficulty in walking, not elsewhere classified: Secondary | ICD-10-CM | POA: Diagnosis not present

## 2017-06-09 DIAGNOSIS — M6281 Muscle weakness (generalized): Secondary | ICD-10-CM | POA: Diagnosis not present

## 2017-06-15 DIAGNOSIS — R262 Difficulty in walking, not elsewhere classified: Secondary | ICD-10-CM | POA: Diagnosis not present

## 2017-06-15 DIAGNOSIS — R2681 Unsteadiness on feet: Secondary | ICD-10-CM | POA: Diagnosis not present

## 2017-06-15 DIAGNOSIS — R296 Repeated falls: Secondary | ICD-10-CM | POA: Diagnosis not present

## 2017-06-15 DIAGNOSIS — M6281 Muscle weakness (generalized): Secondary | ICD-10-CM | POA: Diagnosis not present

## 2017-06-17 DIAGNOSIS — R296 Repeated falls: Secondary | ICD-10-CM | POA: Diagnosis not present

## 2017-06-17 DIAGNOSIS — M6281 Muscle weakness (generalized): Secondary | ICD-10-CM | POA: Diagnosis not present

## 2017-06-17 DIAGNOSIS — R2681 Unsteadiness on feet: Secondary | ICD-10-CM | POA: Diagnosis not present

## 2017-06-17 DIAGNOSIS — R262 Difficulty in walking, not elsewhere classified: Secondary | ICD-10-CM | POA: Diagnosis not present

## 2017-06-19 DIAGNOSIS — R2681 Unsteadiness on feet: Secondary | ICD-10-CM | POA: Diagnosis not present

## 2017-06-19 DIAGNOSIS — M6281 Muscle weakness (generalized): Secondary | ICD-10-CM | POA: Diagnosis not present

## 2017-06-19 DIAGNOSIS — R296 Repeated falls: Secondary | ICD-10-CM | POA: Diagnosis not present

## 2017-06-19 DIAGNOSIS — R262 Difficulty in walking, not elsewhere classified: Secondary | ICD-10-CM | POA: Diagnosis not present

## 2017-06-24 DIAGNOSIS — M6281 Muscle weakness (generalized): Secondary | ICD-10-CM | POA: Diagnosis not present

## 2017-06-24 DIAGNOSIS — R2681 Unsteadiness on feet: Secondary | ICD-10-CM | POA: Diagnosis not present

## 2017-06-24 DIAGNOSIS — R296 Repeated falls: Secondary | ICD-10-CM | POA: Diagnosis not present

## 2017-06-24 DIAGNOSIS — R262 Difficulty in walking, not elsewhere classified: Secondary | ICD-10-CM | POA: Diagnosis not present

## 2017-06-26 DIAGNOSIS — M6281 Muscle weakness (generalized): Secondary | ICD-10-CM | POA: Diagnosis not present

## 2017-06-26 DIAGNOSIS — R262 Difficulty in walking, not elsewhere classified: Secondary | ICD-10-CM | POA: Diagnosis not present

## 2017-06-26 DIAGNOSIS — R296 Repeated falls: Secondary | ICD-10-CM | POA: Diagnosis not present

## 2017-06-26 DIAGNOSIS — R2681 Unsteadiness on feet: Secondary | ICD-10-CM | POA: Diagnosis not present

## 2017-07-01 DIAGNOSIS — R2681 Unsteadiness on feet: Secondary | ICD-10-CM | POA: Diagnosis not present

## 2017-07-01 DIAGNOSIS — R296 Repeated falls: Secondary | ICD-10-CM | POA: Diagnosis not present

## 2017-07-01 DIAGNOSIS — R262 Difficulty in walking, not elsewhere classified: Secondary | ICD-10-CM | POA: Diagnosis not present

## 2017-07-01 DIAGNOSIS — M6281 Muscle weakness (generalized): Secondary | ICD-10-CM | POA: Diagnosis not present

## 2017-07-03 DIAGNOSIS — R2681 Unsteadiness on feet: Secondary | ICD-10-CM | POA: Diagnosis not present

## 2017-07-03 DIAGNOSIS — M6281 Muscle weakness (generalized): Secondary | ICD-10-CM | POA: Diagnosis not present

## 2017-07-03 DIAGNOSIS — R262 Difficulty in walking, not elsewhere classified: Secondary | ICD-10-CM | POA: Diagnosis not present

## 2017-07-03 DIAGNOSIS — R296 Repeated falls: Secondary | ICD-10-CM | POA: Diagnosis not present

## 2017-07-04 DIAGNOSIS — R296 Repeated falls: Secondary | ICD-10-CM | POA: Diagnosis not present

## 2017-07-04 DIAGNOSIS — R2681 Unsteadiness on feet: Secondary | ICD-10-CM | POA: Diagnosis not present

## 2017-07-04 DIAGNOSIS — R262 Difficulty in walking, not elsewhere classified: Secondary | ICD-10-CM | POA: Diagnosis not present

## 2017-07-04 DIAGNOSIS — M6281 Muscle weakness (generalized): Secondary | ICD-10-CM | POA: Diagnosis not present

## 2017-07-06 DIAGNOSIS — R296 Repeated falls: Secondary | ICD-10-CM | POA: Diagnosis not present

## 2017-07-06 DIAGNOSIS — M6281 Muscle weakness (generalized): Secondary | ICD-10-CM | POA: Diagnosis not present

## 2017-07-06 DIAGNOSIS — R262 Difficulty in walking, not elsewhere classified: Secondary | ICD-10-CM | POA: Diagnosis not present

## 2017-07-06 DIAGNOSIS — R2681 Unsteadiness on feet: Secondary | ICD-10-CM | POA: Diagnosis not present

## 2017-07-07 DIAGNOSIS — R262 Difficulty in walking, not elsewhere classified: Secondary | ICD-10-CM | POA: Diagnosis not present

## 2017-07-07 DIAGNOSIS — R296 Repeated falls: Secondary | ICD-10-CM | POA: Diagnosis not present

## 2017-07-07 DIAGNOSIS — M6281 Muscle weakness (generalized): Secondary | ICD-10-CM | POA: Diagnosis not present

## 2017-07-07 DIAGNOSIS — R2681 Unsteadiness on feet: Secondary | ICD-10-CM | POA: Diagnosis not present

## 2017-07-08 DIAGNOSIS — R2681 Unsteadiness on feet: Secondary | ICD-10-CM | POA: Diagnosis not present

## 2017-07-08 DIAGNOSIS — M6281 Muscle weakness (generalized): Secondary | ICD-10-CM | POA: Diagnosis not present

## 2017-07-08 DIAGNOSIS — R262 Difficulty in walking, not elsewhere classified: Secondary | ICD-10-CM | POA: Diagnosis not present

## 2017-07-08 DIAGNOSIS — R296 Repeated falls: Secondary | ICD-10-CM | POA: Diagnosis not present

## 2017-07-13 DIAGNOSIS — M6281 Muscle weakness (generalized): Secondary | ICD-10-CM | POA: Diagnosis not present

## 2017-07-13 DIAGNOSIS — R262 Difficulty in walking, not elsewhere classified: Secondary | ICD-10-CM | POA: Diagnosis not present

## 2017-07-13 DIAGNOSIS — R2681 Unsteadiness on feet: Secondary | ICD-10-CM | POA: Diagnosis not present

## 2017-07-13 DIAGNOSIS — R296 Repeated falls: Secondary | ICD-10-CM | POA: Diagnosis not present

## 2017-07-14 DIAGNOSIS — R262 Difficulty in walking, not elsewhere classified: Secondary | ICD-10-CM | POA: Diagnosis not present

## 2017-07-14 DIAGNOSIS — M6281 Muscle weakness (generalized): Secondary | ICD-10-CM | POA: Diagnosis not present

## 2017-07-14 DIAGNOSIS — R296 Repeated falls: Secondary | ICD-10-CM | POA: Diagnosis not present

## 2017-07-14 DIAGNOSIS — R2681 Unsteadiness on feet: Secondary | ICD-10-CM | POA: Diagnosis not present

## 2017-07-15 DIAGNOSIS — R296 Repeated falls: Secondary | ICD-10-CM | POA: Diagnosis not present

## 2017-07-15 DIAGNOSIS — R262 Difficulty in walking, not elsewhere classified: Secondary | ICD-10-CM | POA: Diagnosis not present

## 2017-07-15 DIAGNOSIS — R2681 Unsteadiness on feet: Secondary | ICD-10-CM | POA: Diagnosis not present

## 2017-07-15 DIAGNOSIS — M6281 Muscle weakness (generalized): Secondary | ICD-10-CM | POA: Diagnosis not present

## 2017-07-16 DIAGNOSIS — R2681 Unsteadiness on feet: Secondary | ICD-10-CM | POA: Diagnosis not present

## 2017-07-16 DIAGNOSIS — M6281 Muscle weakness (generalized): Secondary | ICD-10-CM | POA: Diagnosis not present

## 2017-07-16 DIAGNOSIS — R262 Difficulty in walking, not elsewhere classified: Secondary | ICD-10-CM | POA: Diagnosis not present

## 2017-07-16 DIAGNOSIS — R296 Repeated falls: Secondary | ICD-10-CM | POA: Diagnosis not present

## 2017-07-20 DIAGNOSIS — M6281 Muscle weakness (generalized): Secondary | ICD-10-CM | POA: Diagnosis not present

## 2017-07-20 DIAGNOSIS — R262 Difficulty in walking, not elsewhere classified: Secondary | ICD-10-CM | POA: Diagnosis not present

## 2017-07-20 DIAGNOSIS — R2681 Unsteadiness on feet: Secondary | ICD-10-CM | POA: Diagnosis not present

## 2017-07-20 DIAGNOSIS — R296 Repeated falls: Secondary | ICD-10-CM | POA: Diagnosis not present

## 2017-07-23 DIAGNOSIS — R296 Repeated falls: Secondary | ICD-10-CM | POA: Diagnosis not present

## 2017-07-23 DIAGNOSIS — R2681 Unsteadiness on feet: Secondary | ICD-10-CM | POA: Diagnosis not present

## 2017-07-23 DIAGNOSIS — M6281 Muscle weakness (generalized): Secondary | ICD-10-CM | POA: Diagnosis not present

## 2017-07-23 DIAGNOSIS — R262 Difficulty in walking, not elsewhere classified: Secondary | ICD-10-CM | POA: Diagnosis not present

## 2017-07-27 DIAGNOSIS — I2510 Atherosclerotic heart disease of native coronary artery without angina pectoris: Secondary | ICD-10-CM | POA: Diagnosis not present

## 2017-07-27 DIAGNOSIS — I10 Essential (primary) hypertension: Secondary | ICD-10-CM | POA: Diagnosis not present

## 2017-07-27 DIAGNOSIS — E785 Hyperlipidemia, unspecified: Secondary | ICD-10-CM | POA: Diagnosis not present

## 2017-07-28 DIAGNOSIS — R2681 Unsteadiness on feet: Secondary | ICD-10-CM | POA: Diagnosis not present

## 2017-07-28 DIAGNOSIS — R296 Repeated falls: Secondary | ICD-10-CM | POA: Diagnosis not present

## 2017-07-28 DIAGNOSIS — R262 Difficulty in walking, not elsewhere classified: Secondary | ICD-10-CM | POA: Diagnosis not present

## 2017-07-28 DIAGNOSIS — M6281 Muscle weakness (generalized): Secondary | ICD-10-CM | POA: Diagnosis not present

## 2017-07-29 DIAGNOSIS — R262 Difficulty in walking, not elsewhere classified: Secondary | ICD-10-CM | POA: Diagnosis not present

## 2017-07-29 DIAGNOSIS — R2681 Unsteadiness on feet: Secondary | ICD-10-CM | POA: Diagnosis not present

## 2017-07-29 DIAGNOSIS — R296 Repeated falls: Secondary | ICD-10-CM | POA: Diagnosis not present

## 2017-07-29 DIAGNOSIS — M6281 Muscle weakness (generalized): Secondary | ICD-10-CM | POA: Diagnosis not present

## 2017-07-30 DIAGNOSIS — R296 Repeated falls: Secondary | ICD-10-CM | POA: Diagnosis not present

## 2017-07-30 DIAGNOSIS — R262 Difficulty in walking, not elsewhere classified: Secondary | ICD-10-CM | POA: Diagnosis not present

## 2017-07-30 DIAGNOSIS — M6281 Muscle weakness (generalized): Secondary | ICD-10-CM | POA: Diagnosis not present

## 2017-07-30 DIAGNOSIS — R2681 Unsteadiness on feet: Secondary | ICD-10-CM | POA: Diagnosis not present

## 2017-07-31 DIAGNOSIS — R2681 Unsteadiness on feet: Secondary | ICD-10-CM | POA: Diagnosis not present

## 2017-07-31 DIAGNOSIS — R296 Repeated falls: Secondary | ICD-10-CM | POA: Diagnosis not present

## 2017-07-31 DIAGNOSIS — M6281 Muscle weakness (generalized): Secondary | ICD-10-CM | POA: Diagnosis not present

## 2017-07-31 DIAGNOSIS — R262 Difficulty in walking, not elsewhere classified: Secondary | ICD-10-CM | POA: Diagnosis not present

## 2017-08-03 DIAGNOSIS — M6281 Muscle weakness (generalized): Secondary | ICD-10-CM | POA: Diagnosis not present

## 2017-08-03 DIAGNOSIS — R296 Repeated falls: Secondary | ICD-10-CM | POA: Diagnosis not present

## 2017-08-03 DIAGNOSIS — R2681 Unsteadiness on feet: Secondary | ICD-10-CM | POA: Diagnosis not present

## 2017-08-03 DIAGNOSIS — R262 Difficulty in walking, not elsewhere classified: Secondary | ICD-10-CM | POA: Diagnosis not present

## 2017-08-04 DIAGNOSIS — E1122 Type 2 diabetes mellitus with diabetic chronic kidney disease: Secondary | ICD-10-CM | POA: Diagnosis not present

## 2017-08-04 DIAGNOSIS — J3089 Other allergic rhinitis: Secondary | ICD-10-CM | POA: Diagnosis not present

## 2017-08-04 DIAGNOSIS — I951 Orthostatic hypotension: Secondary | ICD-10-CM | POA: Diagnosis not present

## 2017-08-04 DIAGNOSIS — F332 Major depressive disorder, recurrent severe without psychotic features: Secondary | ICD-10-CM | POA: Diagnosis not present

## 2017-08-05 DIAGNOSIS — M6281 Muscle weakness (generalized): Secondary | ICD-10-CM | POA: Diagnosis not present

## 2017-08-05 DIAGNOSIS — R2681 Unsteadiness on feet: Secondary | ICD-10-CM | POA: Diagnosis not present

## 2017-08-05 DIAGNOSIS — R296 Repeated falls: Secondary | ICD-10-CM | POA: Diagnosis not present

## 2017-08-05 DIAGNOSIS — R262 Difficulty in walking, not elsewhere classified: Secondary | ICD-10-CM | POA: Diagnosis not present

## 2017-08-14 DIAGNOSIS — J3089 Other allergic rhinitis: Secondary | ICD-10-CM | POA: Diagnosis not present

## 2017-08-14 DIAGNOSIS — I951 Orthostatic hypotension: Secondary | ICD-10-CM | POA: Diagnosis not present

## 2017-08-14 DIAGNOSIS — F332 Major depressive disorder, recurrent severe without psychotic features: Secondary | ICD-10-CM | POA: Diagnosis not present

## 2017-08-14 DIAGNOSIS — E1122 Type 2 diabetes mellitus with diabetic chronic kidney disease: Secondary | ICD-10-CM | POA: Diagnosis not present

## 2017-08-31 DIAGNOSIS — R55 Syncope and collapse: Secondary | ICD-10-CM | POA: Diagnosis not present

## 2017-08-31 DIAGNOSIS — N390 Urinary tract infection, site not specified: Secondary | ICD-10-CM | POA: Diagnosis not present

## 2017-08-31 DIAGNOSIS — E785 Hyperlipidemia, unspecified: Secondary | ICD-10-CM | POA: Diagnosis not present

## 2017-08-31 DIAGNOSIS — M6281 Muscle weakness (generalized): Secondary | ICD-10-CM | POA: Diagnosis not present

## 2017-08-31 DIAGNOSIS — I10 Essential (primary) hypertension: Secondary | ICD-10-CM | POA: Diagnosis not present

## 2017-08-31 DIAGNOSIS — R4182 Altered mental status, unspecified: Secondary | ICD-10-CM | POA: Diagnosis not present

## 2017-08-31 DIAGNOSIS — R9082 White matter disease, unspecified: Secondary | ICD-10-CM | POA: Diagnosis not present

## 2017-08-31 DIAGNOSIS — E860 Dehydration: Secondary | ICD-10-CM | POA: Diagnosis not present

## 2017-08-31 DIAGNOSIS — R001 Bradycardia, unspecified: Secondary | ICD-10-CM | POA: Diagnosis not present

## 2017-08-31 DIAGNOSIS — R410 Disorientation, unspecified: Secondary | ICD-10-CM | POA: Diagnosis not present

## 2017-08-31 DIAGNOSIS — N183 Chronic kidney disease, stage 3 (moderate): Secondary | ICD-10-CM | POA: Diagnosis not present

## 2017-08-31 DIAGNOSIS — E119 Type 2 diabetes mellitus without complications: Secondary | ICD-10-CM | POA: Diagnosis not present

## 2017-08-31 DIAGNOSIS — R404 Transient alteration of awareness: Secondary | ICD-10-CM | POA: Diagnosis not present

## 2017-08-31 DIAGNOSIS — Z79899 Other long term (current) drug therapy: Secondary | ICD-10-CM | POA: Diagnosis not present

## 2017-08-31 DIAGNOSIS — Z794 Long term (current) use of insulin: Secondary | ICD-10-CM | POA: Diagnosis not present

## 2017-08-31 DIAGNOSIS — R531 Weakness: Secondary | ICD-10-CM | POA: Diagnosis not present

## 2017-09-01 DIAGNOSIS — R001 Bradycardia, unspecified: Secondary | ICD-10-CM | POA: Diagnosis not present

## 2017-09-08 DIAGNOSIS — I2510 Atherosclerotic heart disease of native coronary artery without angina pectoris: Secondary | ICD-10-CM | POA: Diagnosis not present

## 2017-09-08 DIAGNOSIS — E785 Hyperlipidemia, unspecified: Secondary | ICD-10-CM | POA: Diagnosis not present

## 2017-09-08 DIAGNOSIS — E118 Type 2 diabetes mellitus with unspecified complications: Secondary | ICD-10-CM | POA: Diagnosis not present

## 2017-09-08 DIAGNOSIS — I10 Essential (primary) hypertension: Secondary | ICD-10-CM | POA: Diagnosis not present

## 2017-09-09 DIAGNOSIS — I498 Other specified cardiac arrhythmias: Secondary | ICD-10-CM | POA: Diagnosis not present

## 2017-09-17 DIAGNOSIS — I2510 Atherosclerotic heart disease of native coronary artery without angina pectoris: Secondary | ICD-10-CM | POA: Diagnosis not present

## 2017-09-18 DIAGNOSIS — I2510 Atherosclerotic heart disease of native coronary artery without angina pectoris: Secondary | ICD-10-CM | POA: Diagnosis not present

## 2017-10-29 DIAGNOSIS — Z96651 Presence of right artificial knee joint: Secondary | ICD-10-CM | POA: Diagnosis not present

## 2017-10-29 DIAGNOSIS — Z471 Aftercare following joint replacement surgery: Secondary | ICD-10-CM | POA: Diagnosis not present

## 2017-11-03 DIAGNOSIS — I10 Essential (primary) hypertension: Secondary | ICD-10-CM | POA: Diagnosis not present

## 2017-11-03 DIAGNOSIS — E1142 Type 2 diabetes mellitus with diabetic polyneuropathy: Secondary | ICD-10-CM | POA: Diagnosis not present

## 2017-11-03 DIAGNOSIS — E7849 Other hyperlipidemia: Secondary | ICD-10-CM | POA: Diagnosis not present

## 2017-11-03 DIAGNOSIS — I498 Other specified cardiac arrhythmias: Secondary | ICD-10-CM | POA: Diagnosis not present

## 2017-11-10 DIAGNOSIS — R42 Dizziness and giddiness: Secondary | ICD-10-CM | POA: Diagnosis not present

## 2017-11-10 DIAGNOSIS — I2510 Atherosclerotic heart disease of native coronary artery without angina pectoris: Secondary | ICD-10-CM | POA: Diagnosis not present

## 2017-11-10 DIAGNOSIS — E785 Hyperlipidemia, unspecified: Secondary | ICD-10-CM | POA: Diagnosis not present

## 2017-11-10 DIAGNOSIS — I10 Essential (primary) hypertension: Secondary | ICD-10-CM | POA: Diagnosis not present

## 2017-11-26 DIAGNOSIS — R55 Syncope and collapse: Secondary | ICD-10-CM | POA: Diagnosis not present

## 2017-11-26 DIAGNOSIS — R4189 Other symptoms and signs involving cognitive functions and awareness: Secondary | ICD-10-CM | POA: Diagnosis not present

## 2017-11-26 DIAGNOSIS — I2510 Atherosclerotic heart disease of native coronary artery without angina pectoris: Secondary | ICD-10-CM | POA: Diagnosis not present

## 2017-11-26 DIAGNOSIS — E860 Dehydration: Secondary | ICD-10-CM | POA: Diagnosis not present

## 2017-11-26 DIAGNOSIS — R4182 Altered mental status, unspecified: Secondary | ICD-10-CM | POA: Diagnosis not present

## 2017-11-26 DIAGNOSIS — E119 Type 2 diabetes mellitus without complications: Secondary | ICD-10-CM | POA: Diagnosis not present

## 2017-11-26 DIAGNOSIS — N390 Urinary tract infection, site not specified: Secondary | ICD-10-CM | POA: Diagnosis not present

## 2017-11-27 DIAGNOSIS — R4182 Altered mental status, unspecified: Secondary | ICD-10-CM | POA: Diagnosis present

## 2017-11-27 DIAGNOSIS — S0990XA Unspecified injury of head, initial encounter: Secondary | ICD-10-CM | POA: Diagnosis not present

## 2017-11-27 DIAGNOSIS — E119 Type 2 diabetes mellitus without complications: Secondary | ICD-10-CM | POA: Diagnosis present

## 2017-11-27 DIAGNOSIS — E860 Dehydration: Secondary | ICD-10-CM | POA: Diagnosis present

## 2017-11-27 DIAGNOSIS — Z794 Long term (current) use of insulin: Secondary | ICD-10-CM | POA: Diagnosis not present

## 2017-11-27 DIAGNOSIS — Z79899 Other long term (current) drug therapy: Secondary | ICD-10-CM | POA: Diagnosis not present

## 2017-11-27 DIAGNOSIS — I2510 Atherosclerotic heart disease of native coronary artery without angina pectoris: Secondary | ICD-10-CM | POA: Diagnosis present

## 2017-11-27 DIAGNOSIS — R4189 Other symptoms and signs involving cognitive functions and awareness: Secondary | ICD-10-CM | POA: Diagnosis not present

## 2017-11-27 DIAGNOSIS — I10 Essential (primary) hypertension: Secondary | ICD-10-CM | POA: Diagnosis present

## 2017-11-27 DIAGNOSIS — N390 Urinary tract infection, site not specified: Secondary | ICD-10-CM | POA: Diagnosis present

## 2017-11-27 DIAGNOSIS — E785 Hyperlipidemia, unspecified: Secondary | ICD-10-CM | POA: Diagnosis present

## 2017-11-27 DIAGNOSIS — Z87891 Personal history of nicotine dependence: Secondary | ICD-10-CM | POA: Diagnosis not present

## 2017-12-11 DIAGNOSIS — F0390 Unspecified dementia without behavioral disturbance: Secondary | ICD-10-CM | POA: Diagnosis not present

## 2017-12-11 DIAGNOSIS — I10 Essential (primary) hypertension: Secondary | ICD-10-CM | POA: Diagnosis not present

## 2017-12-11 DIAGNOSIS — R296 Repeated falls: Secondary | ICD-10-CM | POA: Diagnosis not present

## 2017-12-11 DIAGNOSIS — E1140 Type 2 diabetes mellitus with diabetic neuropathy, unspecified: Secondary | ICD-10-CM | POA: Diagnosis not present

## 2017-12-11 DIAGNOSIS — I2510 Atherosclerotic heart disease of native coronary artery without angina pectoris: Secondary | ICD-10-CM | POA: Diagnosis not present

## 2018-01-01 DIAGNOSIS — R296 Repeated falls: Secondary | ICD-10-CM | POA: Diagnosis not present

## 2018-01-01 DIAGNOSIS — E1165 Type 2 diabetes mellitus with hyperglycemia: Secondary | ICD-10-CM | POA: Diagnosis not present

## 2018-01-01 DIAGNOSIS — I10 Essential (primary) hypertension: Secondary | ICD-10-CM | POA: Diagnosis not present

## 2018-01-01 DIAGNOSIS — R413 Other amnesia: Secondary | ICD-10-CM | POA: Diagnosis not present

## 2018-02-03 DIAGNOSIS — E1143 Type 2 diabetes mellitus with diabetic autonomic (poly)neuropathy: Secondary | ICD-10-CM | POA: Diagnosis not present

## 2018-02-03 DIAGNOSIS — E7849 Other hyperlipidemia: Secondary | ICD-10-CM | POA: Diagnosis not present

## 2018-02-03 DIAGNOSIS — R4182 Altered mental status, unspecified: Secondary | ICD-10-CM | POA: Diagnosis not present

## 2018-02-03 DIAGNOSIS — I10 Essential (primary) hypertension: Secondary | ICD-10-CM | POA: Diagnosis not present

## 2018-02-19 DIAGNOSIS — E1140 Type 2 diabetes mellitus with diabetic neuropathy, unspecified: Secondary | ICD-10-CM | POA: Diagnosis not present

## 2018-02-19 DIAGNOSIS — I10 Essential (primary) hypertension: Secondary | ICD-10-CM | POA: Diagnosis not present

## 2018-02-19 DIAGNOSIS — R296 Repeated falls: Secondary | ICD-10-CM | POA: Diagnosis not present

## 2018-02-19 DIAGNOSIS — R413 Other amnesia: Secondary | ICD-10-CM | POA: Diagnosis not present

## 2018-08-24 DIAGNOSIS — Z794 Long term (current) use of insulin: Secondary | ICD-10-CM | POA: Diagnosis not present

## 2018-08-24 DIAGNOSIS — E119 Type 2 diabetes mellitus without complications: Secondary | ICD-10-CM | POA: Diagnosis not present

## 2018-08-24 DIAGNOSIS — R32 Unspecified urinary incontinence: Secondary | ICD-10-CM | POA: Diagnosis not present

## 2018-08-24 DIAGNOSIS — R296 Repeated falls: Secondary | ICD-10-CM | POA: Diagnosis not present

## 2018-08-24 DIAGNOSIS — I10 Essential (primary) hypertension: Secondary | ICD-10-CM | POA: Diagnosis not present

## 2018-09-30 DIAGNOSIS — I10 Essential (primary) hypertension: Secondary | ICD-10-CM | POA: Diagnosis not present

## 2018-09-30 DIAGNOSIS — N2889 Other specified disorders of kidney and ureter: Secondary | ICD-10-CM | POA: Diagnosis not present

## 2018-09-30 DIAGNOSIS — N390 Urinary tract infection, site not specified: Secondary | ICD-10-CM | POA: Diagnosis not present

## 2018-09-30 DIAGNOSIS — K810 Acute cholecystitis: Secondary | ICD-10-CM | POA: Diagnosis not present

## 2018-09-30 DIAGNOSIS — K8020 Calculus of gallbladder without cholecystitis without obstruction: Secondary | ICD-10-CM | POA: Diagnosis not present

## 2018-09-30 DIAGNOSIS — R000 Tachycardia, unspecified: Secondary | ICD-10-CM | POA: Diagnosis not present

## 2018-09-30 DIAGNOSIS — K6389 Other specified diseases of intestine: Secondary | ICD-10-CM | POA: Diagnosis not present

## 2018-09-30 DIAGNOSIS — K5289 Other specified noninfective gastroenteritis and colitis: Secondary | ICD-10-CM | POA: Diagnosis not present

## 2018-09-30 DIAGNOSIS — R1111 Vomiting without nausea: Secondary | ICD-10-CM | POA: Diagnosis not present

## 2018-09-30 DIAGNOSIS — R5381 Other malaise: Secondary | ICD-10-CM | POA: Diagnosis not present

## 2018-09-30 DIAGNOSIS — R112 Nausea with vomiting, unspecified: Secondary | ICD-10-CM | POA: Diagnosis not present

## 2018-09-30 DIAGNOSIS — R002 Palpitations: Secondary | ICD-10-CM | POA: Diagnosis not present

## 2018-09-30 DIAGNOSIS — E785 Hyperlipidemia, unspecified: Secondary | ICD-10-CM | POA: Diagnosis not present

## 2018-09-30 DIAGNOSIS — E1065 Type 1 diabetes mellitus with hyperglycemia: Secondary | ICD-10-CM | POA: Diagnosis not present

## 2018-09-30 DIAGNOSIS — R0902 Hypoxemia: Secondary | ICD-10-CM | POA: Diagnosis not present

## 2018-09-30 DIAGNOSIS — K838 Other specified diseases of biliary tract: Secondary | ICD-10-CM | POA: Diagnosis not present

## 2018-10-01 DIAGNOSIS — D649 Anemia, unspecified: Secondary | ICD-10-CM | POA: Diagnosis not present

## 2018-10-01 DIAGNOSIS — R339 Retention of urine, unspecified: Secondary | ICD-10-CM | POA: Diagnosis not present

## 2018-10-01 DIAGNOSIS — E119 Type 2 diabetes mellitus without complications: Secondary | ICD-10-CM | POA: Diagnosis present

## 2018-10-01 DIAGNOSIS — I2510 Atherosclerotic heart disease of native coronary artery without angina pectoris: Secondary | ICD-10-CM | POA: Diagnosis present

## 2018-10-01 DIAGNOSIS — K810 Acute cholecystitis: Secondary | ICD-10-CM | POA: Diagnosis not present

## 2018-10-01 DIAGNOSIS — M6281 Muscle weakness (generalized): Secondary | ICD-10-CM | POA: Diagnosis not present

## 2018-10-01 DIAGNOSIS — K5289 Other specified noninfective gastroenteritis and colitis: Secondary | ICD-10-CM | POA: Diagnosis not present

## 2018-10-01 DIAGNOSIS — I959 Hypotension, unspecified: Secondary | ICD-10-CM | POA: Diagnosis not present

## 2018-10-01 DIAGNOSIS — R112 Nausea with vomiting, unspecified: Secondary | ICD-10-CM | POA: Diagnosis not present

## 2018-10-01 DIAGNOSIS — E785 Hyperlipidemia, unspecified: Secondary | ICD-10-CM | POA: Diagnosis not present

## 2018-10-01 DIAGNOSIS — R54 Age-related physical debility: Secondary | ICD-10-CM | POA: Diagnosis not present

## 2018-10-01 DIAGNOSIS — N390 Urinary tract infection, site not specified: Secondary | ICD-10-CM | POA: Diagnosis not present

## 2018-10-01 DIAGNOSIS — R109 Unspecified abdominal pain: Secondary | ICD-10-CM | POA: Diagnosis not present

## 2018-10-01 DIAGNOSIS — K5900 Constipation, unspecified: Secondary | ICD-10-CM | POA: Diagnosis not present

## 2018-10-01 DIAGNOSIS — K8046 Calculus of bile duct with acute and chronic cholecystitis without obstruction: Secondary | ICD-10-CM | POA: Diagnosis not present

## 2018-10-01 DIAGNOSIS — R9431 Abnormal electrocardiogram [ECG] [EKG]: Secondary | ICD-10-CM | POA: Diagnosis not present

## 2018-10-01 DIAGNOSIS — Z1159 Encounter for screening for other viral diseases: Secondary | ICD-10-CM | POA: Diagnosis not present

## 2018-10-01 DIAGNOSIS — K8012 Calculus of gallbladder with acute and chronic cholecystitis without obstruction: Secondary | ICD-10-CM | POA: Diagnosis not present

## 2018-10-01 DIAGNOSIS — E871 Hypo-osmolality and hyponatremia: Secondary | ICD-10-CM | POA: Diagnosis not present

## 2018-10-01 DIAGNOSIS — F0390 Unspecified dementia without behavioral disturbance: Secondary | ICD-10-CM | POA: Diagnosis present

## 2018-10-01 DIAGNOSIS — R410 Disorientation, unspecified: Secondary | ICD-10-CM | POA: Diagnosis not present

## 2018-10-01 DIAGNOSIS — K8000 Calculus of gallbladder with acute cholecystitis without obstruction: Secondary | ICD-10-CM | POA: Diagnosis present

## 2018-10-01 DIAGNOSIS — N319 Neuromuscular dysfunction of bladder, unspecified: Secondary | ICD-10-CM | POA: Diagnosis not present

## 2018-10-01 DIAGNOSIS — F329 Major depressive disorder, single episode, unspecified: Secondary | ICD-10-CM | POA: Diagnosis not present

## 2018-10-01 DIAGNOSIS — E1065 Type 1 diabetes mellitus with hyperglycemia: Secondary | ICD-10-CM | POA: Diagnosis not present

## 2018-10-01 DIAGNOSIS — I10 Essential (primary) hypertension: Secondary | ICD-10-CM | POA: Diagnosis not present

## 2018-10-01 DIAGNOSIS — R001 Bradycardia, unspecified: Secondary | ICD-10-CM | POA: Diagnosis not present

## 2018-10-01 DIAGNOSIS — M1990 Unspecified osteoarthritis, unspecified site: Secondary | ICD-10-CM | POA: Diagnosis not present

## 2018-10-01 DIAGNOSIS — E46 Unspecified protein-calorie malnutrition: Secondary | ICD-10-CM | POA: Diagnosis not present

## 2018-10-07 DIAGNOSIS — R339 Retention of urine, unspecified: Secondary | ICD-10-CM | POA: Diagnosis not present

## 2018-10-07 DIAGNOSIS — R5381 Other malaise: Secondary | ICD-10-CM | POA: Diagnosis not present

## 2018-10-07 DIAGNOSIS — E119 Type 2 diabetes mellitus without complications: Secondary | ICD-10-CM | POA: Diagnosis not present

## 2018-10-07 DIAGNOSIS — I959 Hypotension, unspecified: Secondary | ICD-10-CM | POA: Diagnosis not present

## 2018-10-07 DIAGNOSIS — R413 Other amnesia: Secondary | ICD-10-CM | POA: Diagnosis not present

## 2018-10-07 DIAGNOSIS — N319 Neuromuscular dysfunction of bladder, unspecified: Secondary | ICD-10-CM | POA: Diagnosis not present

## 2018-10-07 DIAGNOSIS — E46 Unspecified protein-calorie malnutrition: Secondary | ICD-10-CM | POA: Diagnosis not present

## 2018-10-07 DIAGNOSIS — I10 Essential (primary) hypertension: Secondary | ICD-10-CM | POA: Diagnosis not present

## 2018-10-07 DIAGNOSIS — D649 Anemia, unspecified: Secondary | ICD-10-CM | POA: Diagnosis not present

## 2018-10-07 DIAGNOSIS — M6281 Muscle weakness (generalized): Secondary | ICD-10-CM | POA: Diagnosis not present

## 2018-10-07 DIAGNOSIS — I2510 Atherosclerotic heart disease of native coronary artery without angina pectoris: Secondary | ICD-10-CM | POA: Diagnosis not present

## 2018-10-07 DIAGNOSIS — M1990 Unspecified osteoarthritis, unspecified site: Secondary | ICD-10-CM | POA: Diagnosis not present

## 2018-10-07 DIAGNOSIS — E1169 Type 2 diabetes mellitus with other specified complication: Secondary | ICD-10-CM | POA: Diagnosis not present

## 2018-10-07 DIAGNOSIS — K5900 Constipation, unspecified: Secondary | ICD-10-CM | POA: Diagnosis not present

## 2018-10-07 DIAGNOSIS — K810 Acute cholecystitis: Secondary | ICD-10-CM | POA: Diagnosis not present

## 2018-10-07 DIAGNOSIS — E871 Hypo-osmolality and hyponatremia: Secondary | ICD-10-CM | POA: Diagnosis not present

## 2018-10-07 DIAGNOSIS — F329 Major depressive disorder, single episode, unspecified: Secondary | ICD-10-CM | POA: Diagnosis not present

## 2018-10-07 DIAGNOSIS — Z9049 Acquired absence of other specified parts of digestive tract: Secondary | ICD-10-CM | POA: Diagnosis not present

## 2018-10-07 DIAGNOSIS — M159 Polyosteoarthritis, unspecified: Secondary | ICD-10-CM | POA: Diagnosis not present

## 2018-10-07 DIAGNOSIS — E669 Obesity, unspecified: Secondary | ICD-10-CM | POA: Diagnosis not present

## 2018-10-07 DIAGNOSIS — D62 Acute posthemorrhagic anemia: Secondary | ICD-10-CM | POA: Diagnosis not present

## 2018-10-07 DIAGNOSIS — R54 Age-related physical debility: Secondary | ICD-10-CM | POA: Diagnosis not present

## 2018-10-07 DIAGNOSIS — E785 Hyperlipidemia, unspecified: Secondary | ICD-10-CM | POA: Diagnosis not present

## 2018-10-08 DIAGNOSIS — M159 Polyosteoarthritis, unspecified: Secondary | ICD-10-CM | POA: Diagnosis not present

## 2018-10-08 DIAGNOSIS — R5381 Other malaise: Secondary | ICD-10-CM | POA: Diagnosis not present

## 2018-10-08 DIAGNOSIS — E46 Unspecified protein-calorie malnutrition: Secondary | ICD-10-CM | POA: Diagnosis not present

## 2018-10-08 DIAGNOSIS — E1169 Type 2 diabetes mellitus with other specified complication: Secondary | ICD-10-CM | POA: Diagnosis not present

## 2018-10-08 DIAGNOSIS — E871 Hypo-osmolality and hyponatremia: Secondary | ICD-10-CM | POA: Diagnosis not present

## 2018-10-08 DIAGNOSIS — I10 Essential (primary) hypertension: Secondary | ICD-10-CM | POA: Diagnosis not present

## 2018-10-08 DIAGNOSIS — R339 Retention of urine, unspecified: Secondary | ICD-10-CM | POA: Diagnosis not present

## 2018-10-08 DIAGNOSIS — Z9049 Acquired absence of other specified parts of digestive tract: Secondary | ICD-10-CM | POA: Diagnosis not present

## 2018-10-08 DIAGNOSIS — D62 Acute posthemorrhagic anemia: Secondary | ICD-10-CM | POA: Diagnosis not present

## 2018-10-08 DIAGNOSIS — E669 Obesity, unspecified: Secondary | ICD-10-CM | POA: Diagnosis not present

## 2018-10-08 DIAGNOSIS — K810 Acute cholecystitis: Secondary | ICD-10-CM | POA: Diagnosis not present

## 2018-10-08 DIAGNOSIS — R413 Other amnesia: Secondary | ICD-10-CM | POA: Diagnosis not present

## 2018-10-18 DIAGNOSIS — I10 Essential (primary) hypertension: Secondary | ICD-10-CM | POA: Diagnosis not present

## 2018-10-18 DIAGNOSIS — Z9049 Acquired absence of other specified parts of digestive tract: Secondary | ICD-10-CM | POA: Diagnosis not present

## 2018-10-18 DIAGNOSIS — E1169 Type 2 diabetes mellitus with other specified complication: Secondary | ICD-10-CM | POA: Diagnosis not present

## 2018-10-18 DIAGNOSIS — E669 Obesity, unspecified: Secondary | ICD-10-CM | POA: Diagnosis not present

## 2018-10-18 DIAGNOSIS — K810 Acute cholecystitis: Secondary | ICD-10-CM | POA: Diagnosis not present

## 2018-10-25 DIAGNOSIS — E669 Obesity, unspecified: Secondary | ICD-10-CM | POA: Diagnosis not present

## 2018-10-25 DIAGNOSIS — I2510 Atherosclerotic heart disease of native coronary artery without angina pectoris: Secondary | ICD-10-CM | POA: Diagnosis not present

## 2018-10-25 DIAGNOSIS — Z9049 Acquired absence of other specified parts of digestive tract: Secondary | ICD-10-CM | POA: Diagnosis not present

## 2018-10-25 DIAGNOSIS — E1169 Type 2 diabetes mellitus with other specified complication: Secondary | ICD-10-CM | POA: Diagnosis not present

## 2018-10-25 DIAGNOSIS — I10 Essential (primary) hypertension: Secondary | ICD-10-CM | POA: Diagnosis not present

## 2018-10-28 DIAGNOSIS — Z48815 Encounter for surgical aftercare following surgery on the digestive system: Secondary | ICD-10-CM | POA: Diagnosis not present

## 2018-10-28 DIAGNOSIS — Z794 Long term (current) use of insulin: Secondary | ICD-10-CM | POA: Diagnosis not present

## 2018-10-28 DIAGNOSIS — D649 Anemia, unspecified: Secondary | ICD-10-CM | POA: Diagnosis not present

## 2018-10-28 DIAGNOSIS — E1151 Type 2 diabetes mellitus with diabetic peripheral angiopathy without gangrene: Secondary | ICD-10-CM | POA: Diagnosis not present

## 2018-11-01 DIAGNOSIS — Z794 Long term (current) use of insulin: Secondary | ICD-10-CM | POA: Diagnosis not present

## 2018-11-01 DIAGNOSIS — D649 Anemia, unspecified: Secondary | ICD-10-CM | POA: Diagnosis not present

## 2018-11-01 DIAGNOSIS — Z48815 Encounter for surgical aftercare following surgery on the digestive system: Secondary | ICD-10-CM | POA: Diagnosis not present

## 2018-11-01 DIAGNOSIS — E1151 Type 2 diabetes mellitus with diabetic peripheral angiopathy without gangrene: Secondary | ICD-10-CM | POA: Diagnosis not present

## 2018-11-05 DIAGNOSIS — Z794 Long term (current) use of insulin: Secondary | ICD-10-CM | POA: Diagnosis not present

## 2018-11-05 DIAGNOSIS — Z48815 Encounter for surgical aftercare following surgery on the digestive system: Secondary | ICD-10-CM | POA: Diagnosis not present

## 2018-11-05 DIAGNOSIS — D649 Anemia, unspecified: Secondary | ICD-10-CM | POA: Diagnosis not present

## 2018-11-05 DIAGNOSIS — E1151 Type 2 diabetes mellitus with diabetic peripheral angiopathy without gangrene: Secondary | ICD-10-CM | POA: Diagnosis not present

## 2018-11-10 DIAGNOSIS — N3942 Incontinence without sensory awareness: Secondary | ICD-10-CM | POA: Diagnosis not present

## 2018-11-10 DIAGNOSIS — R296 Repeated falls: Secondary | ICD-10-CM | POA: Diagnosis not present

## 2018-11-10 DIAGNOSIS — Z9049 Acquired absence of other specified parts of digestive tract: Secondary | ICD-10-CM | POA: Diagnosis not present

## 2018-11-10 DIAGNOSIS — E1140 Type 2 diabetes mellitus with diabetic neuropathy, unspecified: Secondary | ICD-10-CM | POA: Diagnosis not present

## 2018-11-10 DIAGNOSIS — F0390 Unspecified dementia without behavioral disturbance: Secondary | ICD-10-CM | POA: Diagnosis not present

## 2018-11-10 DIAGNOSIS — R413 Other amnesia: Secondary | ICD-10-CM | POA: Diagnosis not present

## 2018-11-11 DIAGNOSIS — D649 Anemia, unspecified: Secondary | ICD-10-CM | POA: Diagnosis not present

## 2018-11-11 DIAGNOSIS — Z48815 Encounter for surgical aftercare following surgery on the digestive system: Secondary | ICD-10-CM | POA: Diagnosis not present

## 2018-11-11 DIAGNOSIS — E1151 Type 2 diabetes mellitus with diabetic peripheral angiopathy without gangrene: Secondary | ICD-10-CM | POA: Diagnosis not present

## 2018-11-11 DIAGNOSIS — Z794 Long term (current) use of insulin: Secondary | ICD-10-CM | POA: Diagnosis not present

## 2018-11-15 DIAGNOSIS — D649 Anemia, unspecified: Secondary | ICD-10-CM | POA: Diagnosis not present

## 2018-11-15 DIAGNOSIS — E1151 Type 2 diabetes mellitus with diabetic peripheral angiopathy without gangrene: Secondary | ICD-10-CM | POA: Diagnosis not present

## 2018-11-15 DIAGNOSIS — Z794 Long term (current) use of insulin: Secondary | ICD-10-CM | POA: Diagnosis not present

## 2018-11-15 DIAGNOSIS — Z48815 Encounter for surgical aftercare following surgery on the digestive system: Secondary | ICD-10-CM | POA: Diagnosis not present

## 2018-11-16 IMAGING — DX DG CHEST 2V
2 series · 2 of 2 positions shown · non-contrast
Comparison: None.

CLINICAL DATA: Cough, confusion.  Poor historian.

EXAM:
CHEST  2 VIEW

[chest lat]
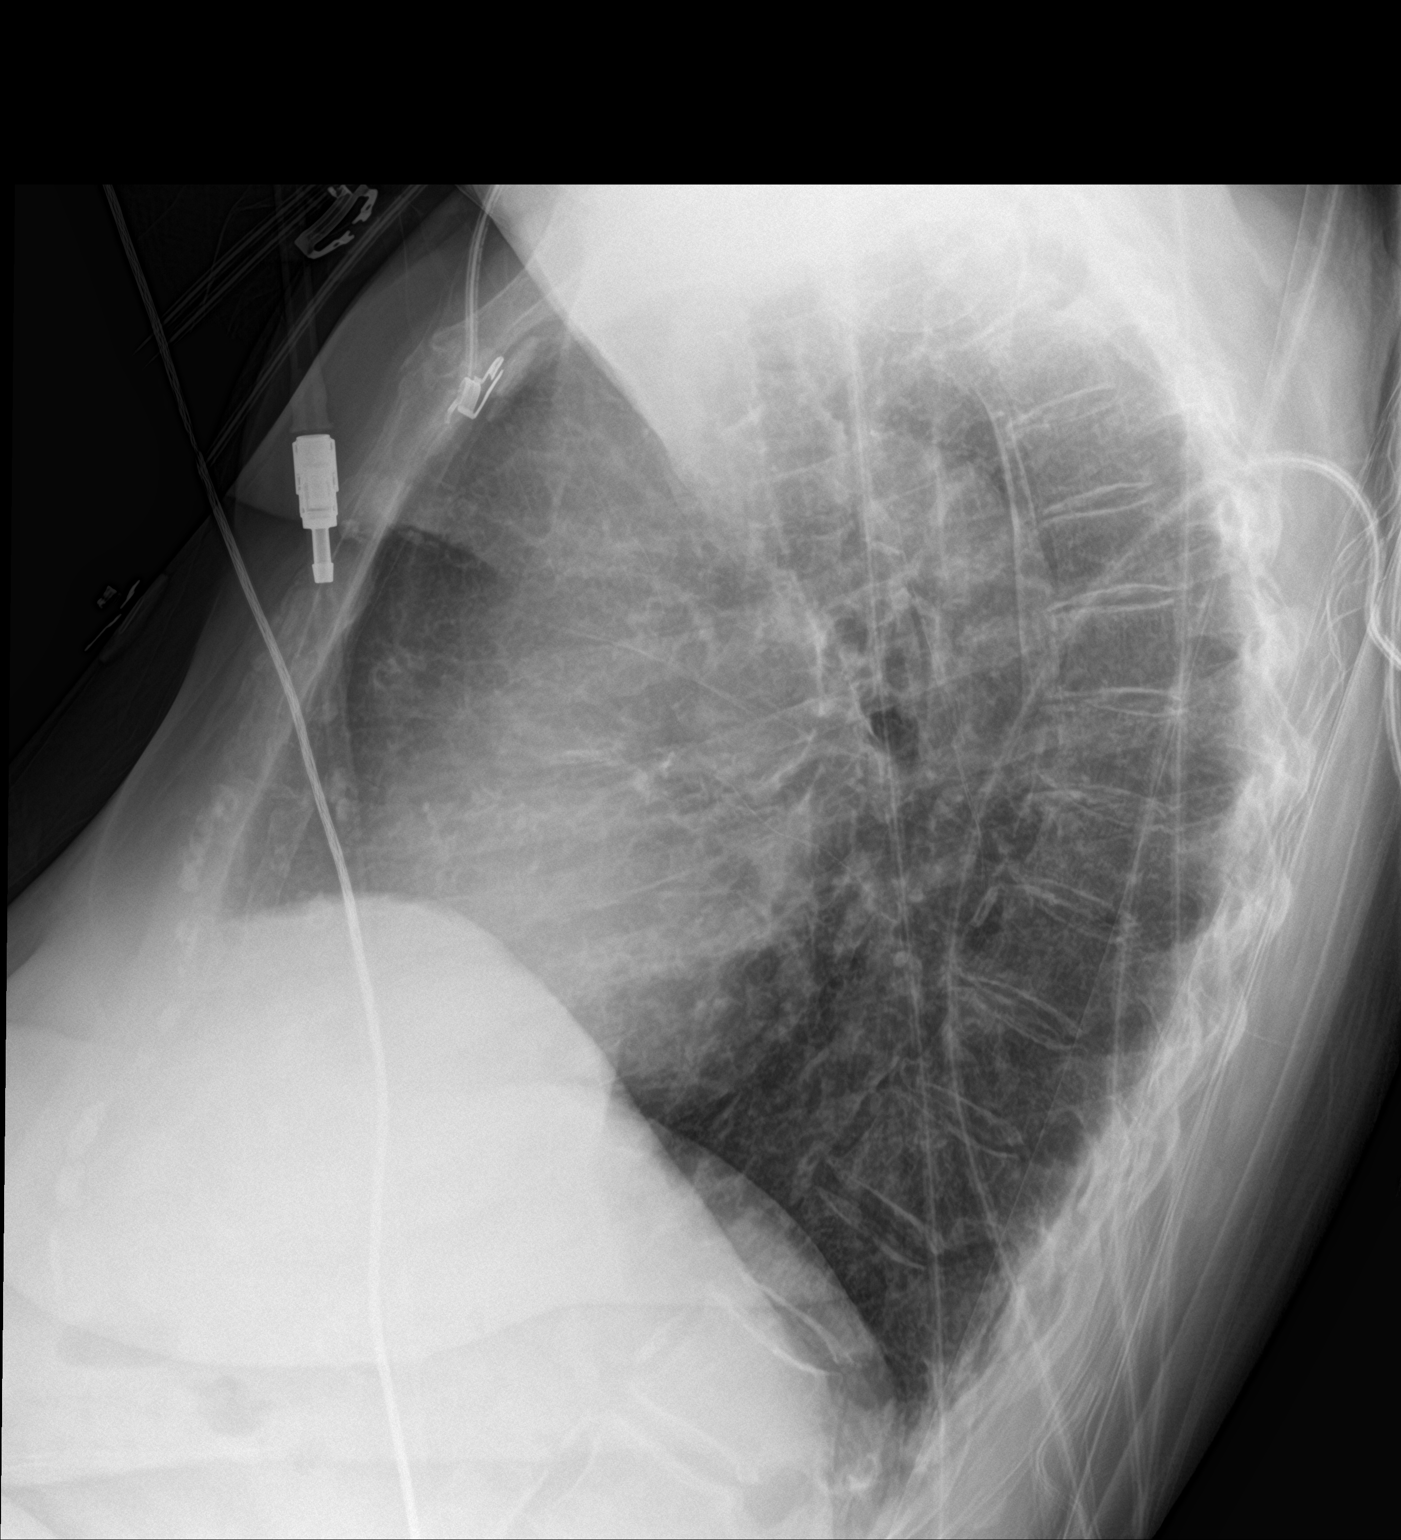

[chest ap]
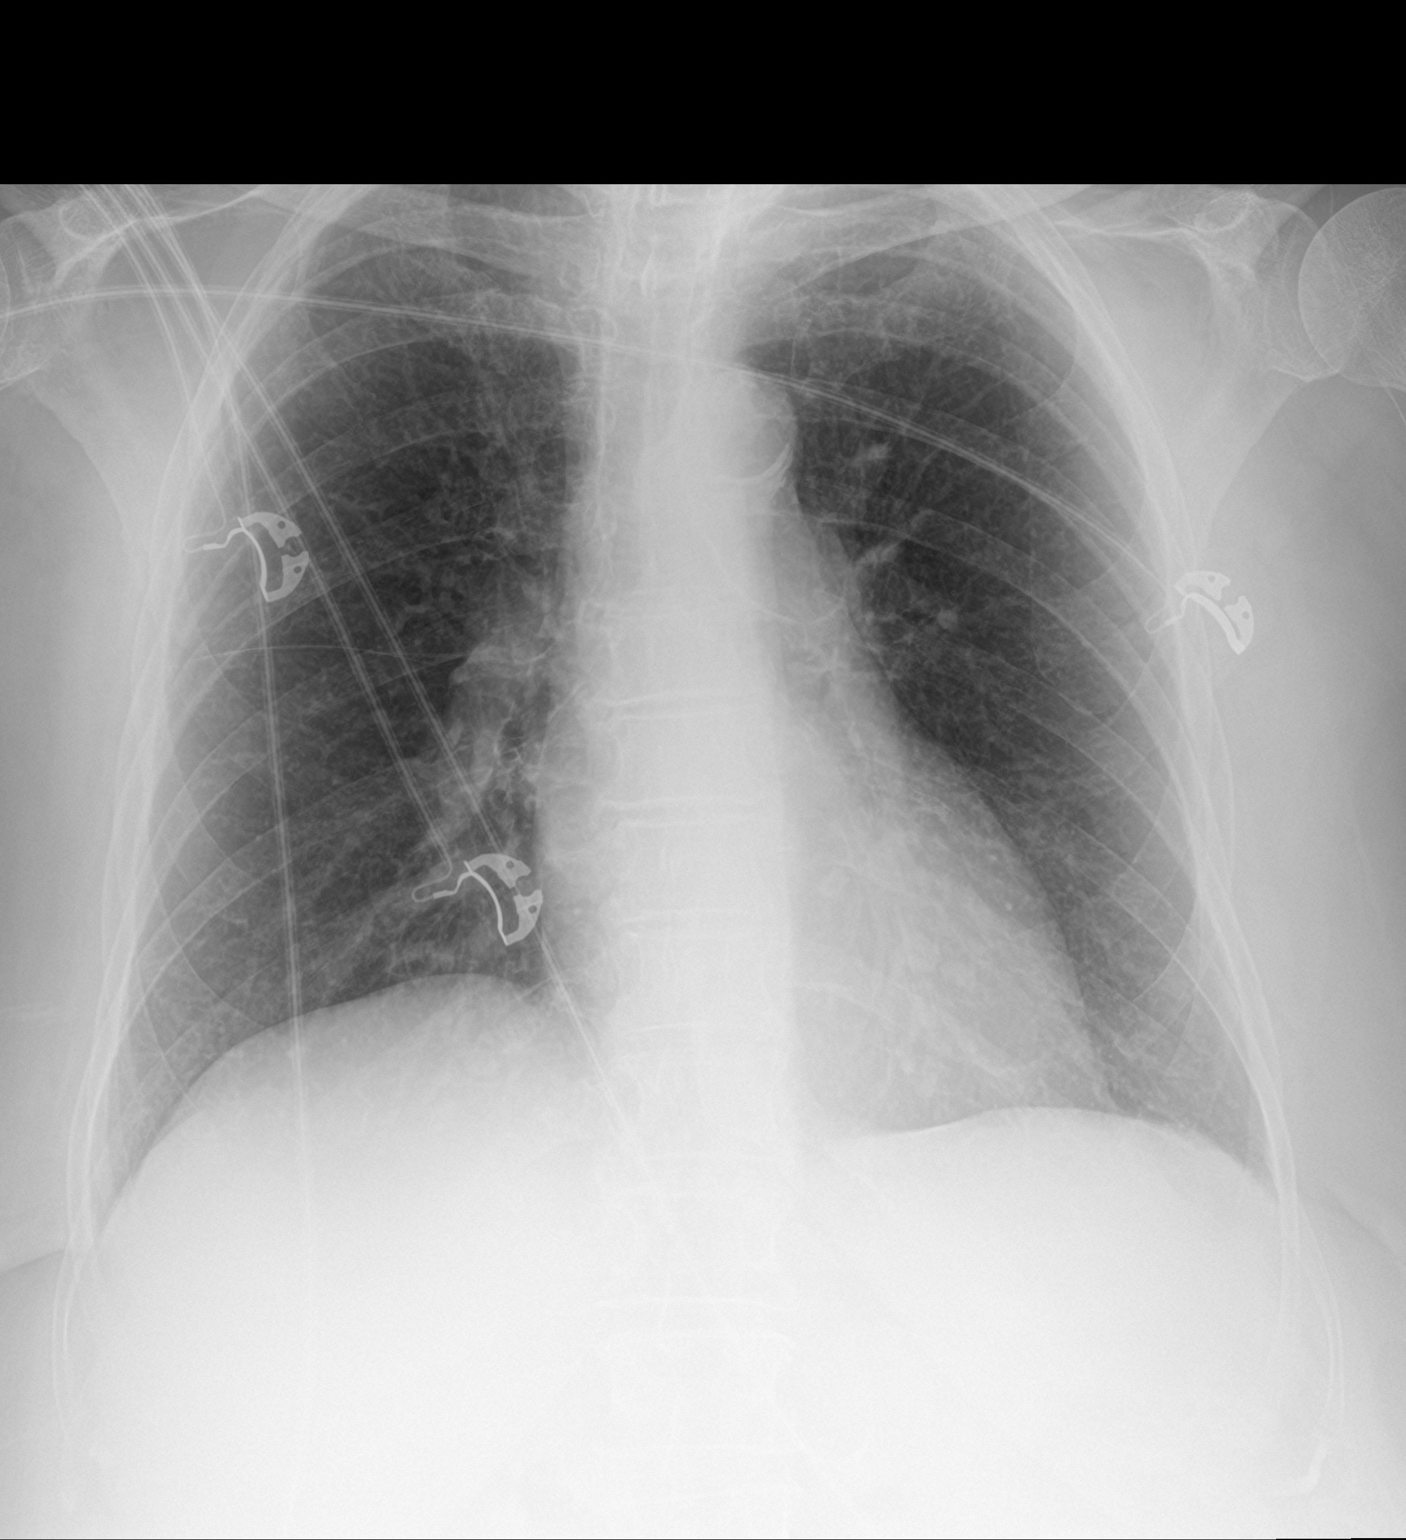

[2 of 2 positions shown; findings below may reference images not displayed]

FINDINGS: Cardiomediastinal silhouette is normal. Calcified aortic knob. Mild
interstitial prominence. No pleural effusions or focal
consolidations. Trachea projects midline and there is no
pneumothorax. Soft tissue planes and included osseous structures are
non-suspicious. Osteopenia.
IMPRESSION: Mild interstitial prominence, possibly chronic. No focal
consolidation.

Aortic Atherosclerosis ([id number]).

## 2018-11-17 ENCOUNTER — Other Ambulatory Visit: Payer: Self-pay

## 2018-11-17 DIAGNOSIS — E1151 Type 2 diabetes mellitus with diabetic peripheral angiopathy without gangrene: Secondary | ICD-10-CM | POA: Diagnosis not present

## 2018-11-17 DIAGNOSIS — Z794 Long term (current) use of insulin: Secondary | ICD-10-CM | POA: Diagnosis not present

## 2018-11-17 DIAGNOSIS — D649 Anemia, unspecified: Secondary | ICD-10-CM | POA: Diagnosis not present

## 2018-11-17 DIAGNOSIS — Z48815 Encounter for surgical aftercare following surgery on the digestive system: Secondary | ICD-10-CM | POA: Diagnosis not present

## 2018-11-17 IMAGING — MR MR HEAD W/O CM
9 of 10 series · 38 of 48 positions shown · non-contrast
Comparison: CT head 01/10/2017

CLINICAL DATA: Altered level of consciousness.  Diabetes.

EXAM:
MRI HEAD WITHOUT CONTRAST
TECHNIQUE: Multiplanar, multiecho pulse sequences of the brain and surrounding
structures were obtained without intravenous contrast.

[Series 3: DWI · axial · 3.0mm · 1.09mm/px · z∈[-92,+38]mm · 11 of 96 slices shown (1 of 4)]
[im 1/96]
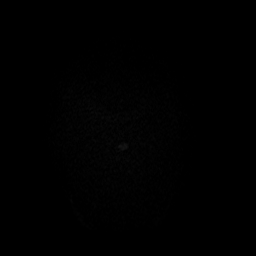
[im 10/96]
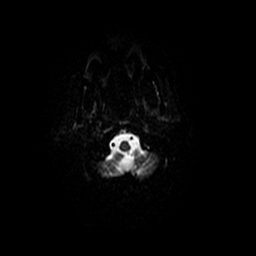
[im 20/96]
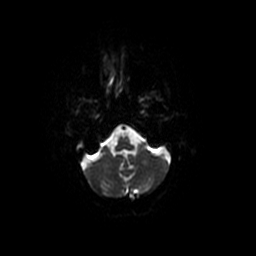
[im 29/96]
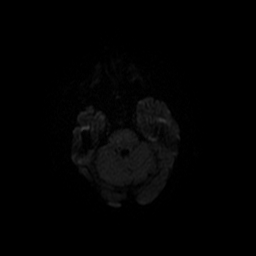
[im 39/96]
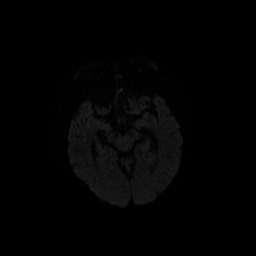
[im 48/96]
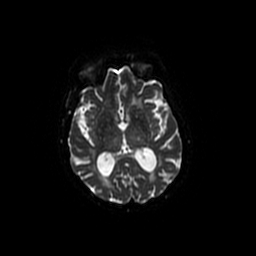
[im 58/96]
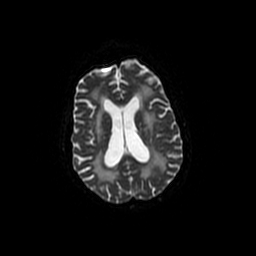
[im 67/96]
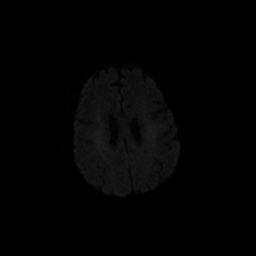
[im 77/96]
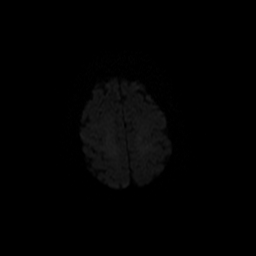
[im 86/96]
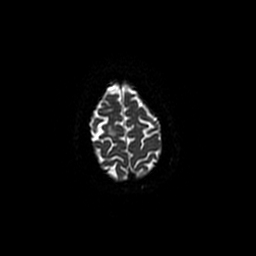
[im 96/96]
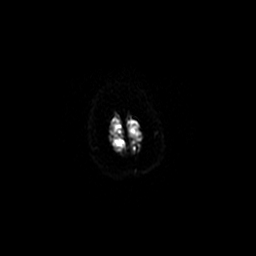

[Series 4: DWI · coronal · 5.0mm · 1.09mm/px · 8 of 76 slices shown (2 of 4)]
[im 1/76]
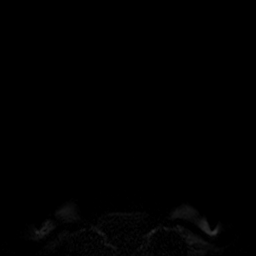
[im 11/76]
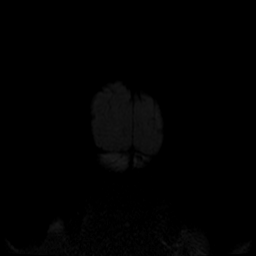
[im 22/76]
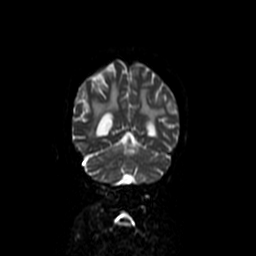
[im 33/76]
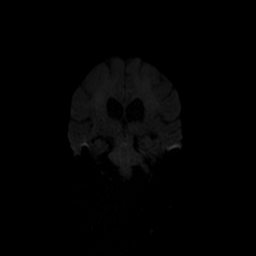
[im 43/76]
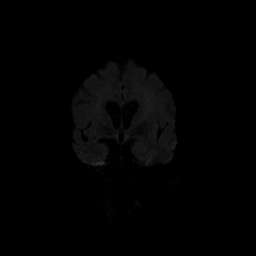
[im 54/76]
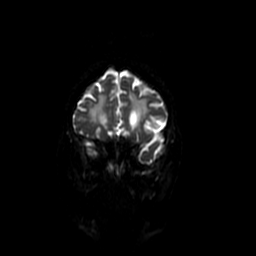
[im 65/76]
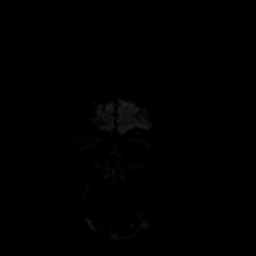
[im 76/76]
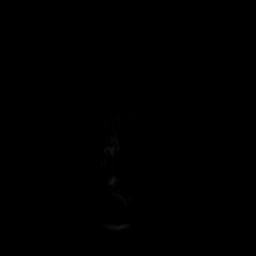

[Series 5: T1 · sagittal · 5.0mm · 0.47mm/px · 2 of 23 slices shown]
[im 1/23]
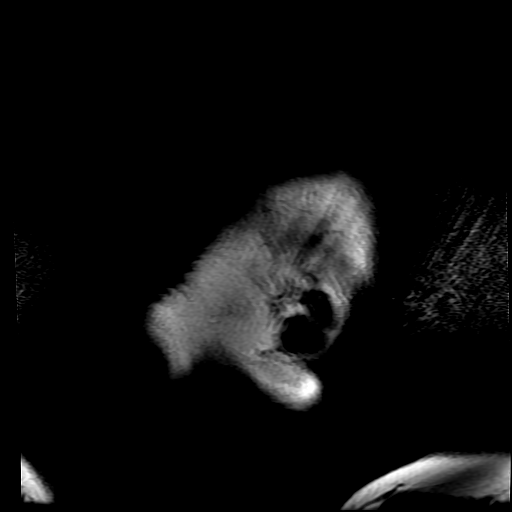
[im 23/23]
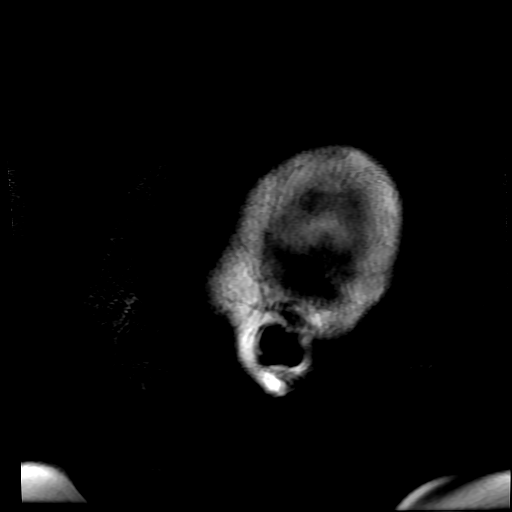

[Series 6: T2 · axial · 5.0mm · 0.47mm/px · z∈[-112,+21]mm · 2 of 25 slices shown (1 of 2)]
[im 1/25]
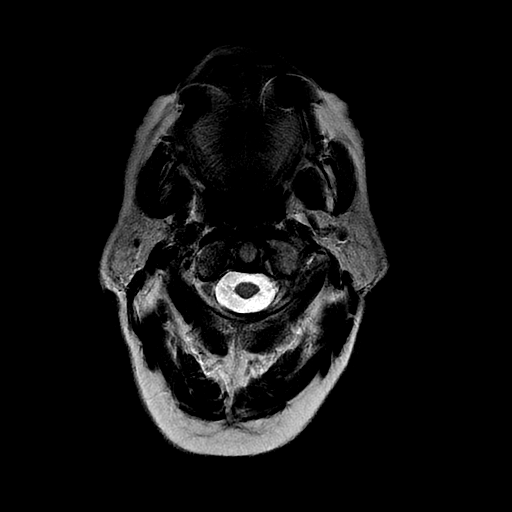
[im 25/25]
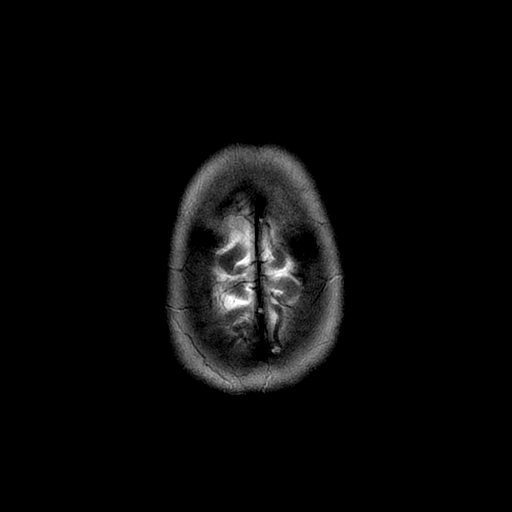

[Series 7: FLAIR · axial · 5.0mm · 0.47mm/px · z∈[-112,+21]mm · 2 of 25 slices shown]
[im 1/25]
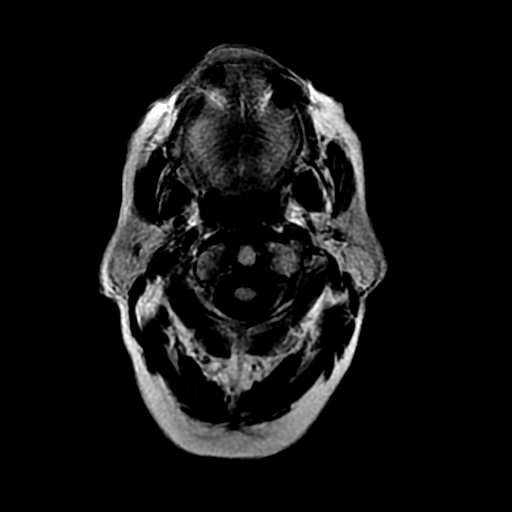
[im 25/25]
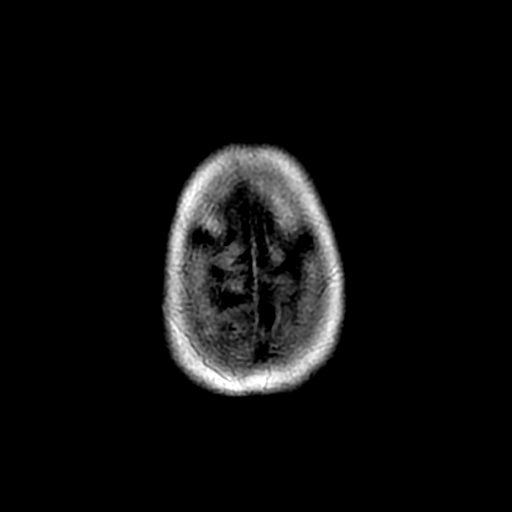

[Series 8: ax mpgr · axial · 5.0mm · 0.43mm/px · z∈[-131,+8]mm · 2 of 22 slices shown]
[im 1/22]
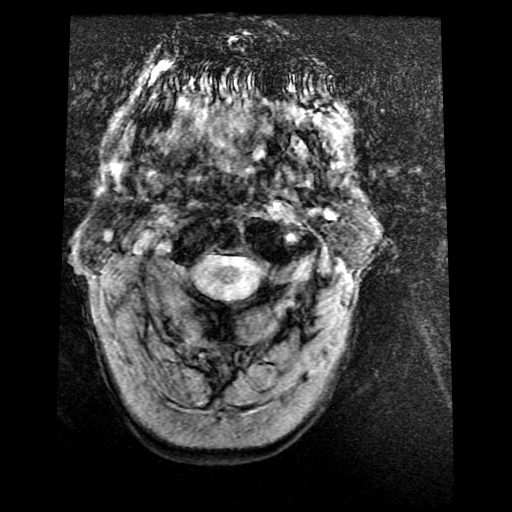
[im 22/22]
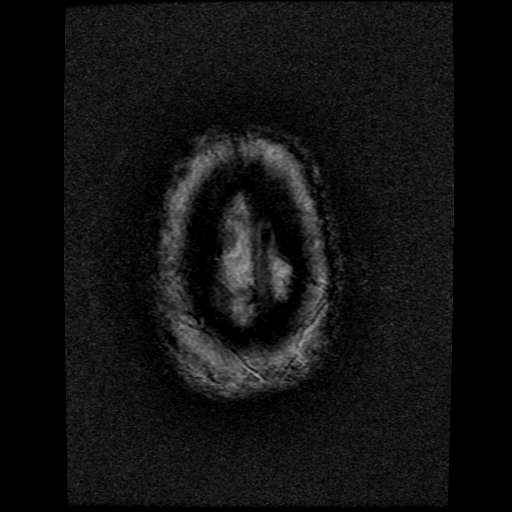

[Series 10: T2 · coronal · 5.0mm · 0.43mm/px · 2 of 24 slices shown (2 of 2)]
[im 1/24]
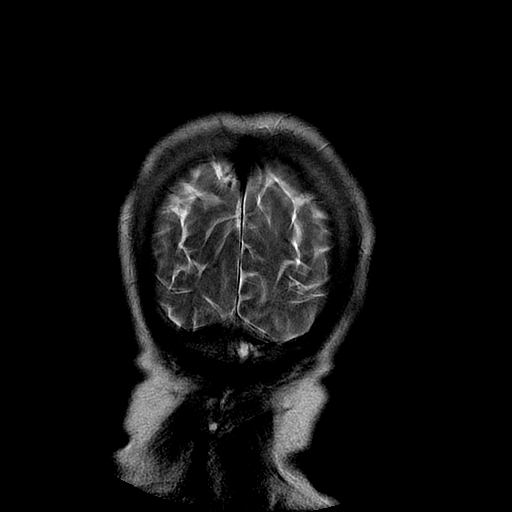
[im 24/24]
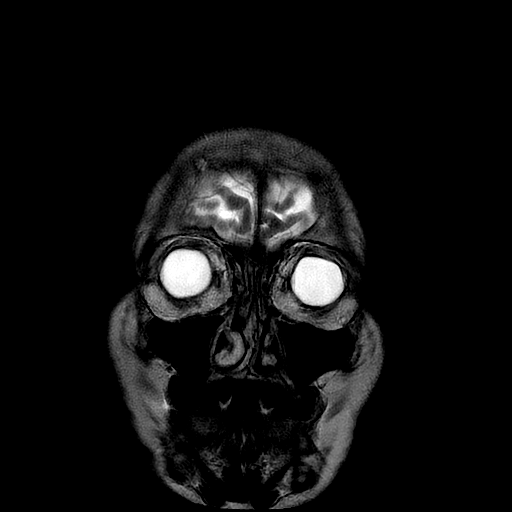

[Series 300: DWI · axial · 3.0mm · 1.09mm/px · z∈[-92,+38]mm · 5 of 48 slices shown (3 of 4)]
[im 1/48]
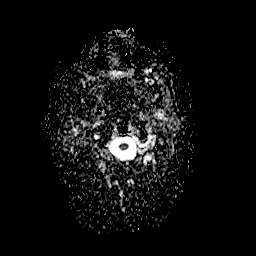
[im 12/48]
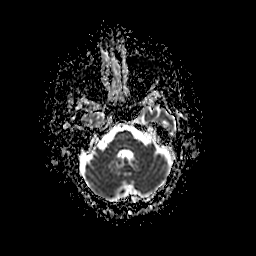
[im 24/48]
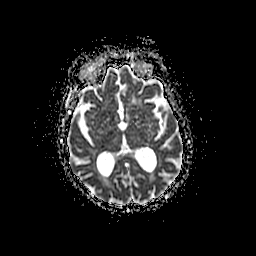
[im 36/48]
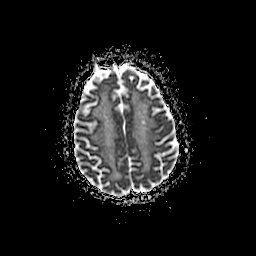
[im 48/48]
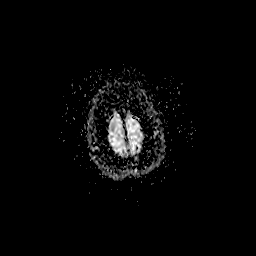

[Series 400: DWI · coronal · 5.0mm · 1.09mm/px · 4 of 38 slices shown (4 of 4)]
[im 1/38]
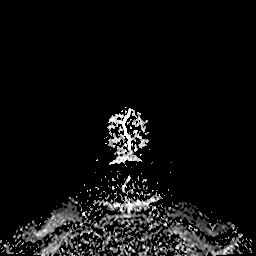
[im 13/38]
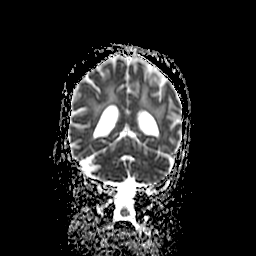
[im 25/38]
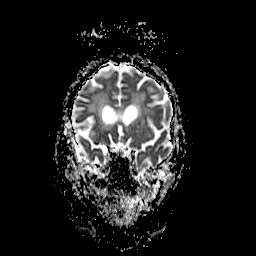
[im 38/38]
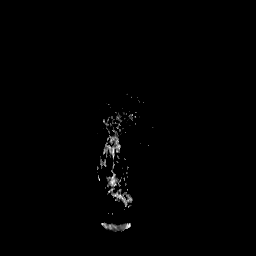

[38 of 48 positions shown; findings below may reference images not displayed]

FINDINGS: Brain: Mild atrophy. Negative for hydrocephalus. Extensive white
matter hyperintensity diffusely. Patchy hyperintensity in the
thalamus and pons bilaterally. Negative for acute infarct,
hemorrhage, mass

Vascular: Normal arterial flow voids

Skull and upper cervical spine: Negative

Sinuses/Orbits: Negative

Other: None
IMPRESSION: Atrophy and extensive chronic microvascular ischemia. No acute
abnormality.

## 2018-11-22 DIAGNOSIS — Z794 Long term (current) use of insulin: Secondary | ICD-10-CM | POA: Diagnosis not present

## 2018-11-22 DIAGNOSIS — Z48815 Encounter for surgical aftercare following surgery on the digestive system: Secondary | ICD-10-CM | POA: Diagnosis not present

## 2018-11-22 DIAGNOSIS — D649 Anemia, unspecified: Secondary | ICD-10-CM | POA: Diagnosis not present

## 2018-11-22 DIAGNOSIS — E1151 Type 2 diabetes mellitus with diabetic peripheral angiopathy without gangrene: Secondary | ICD-10-CM | POA: Diagnosis not present

## 2019-01-14 DIAGNOSIS — Z23 Encounter for immunization: Secondary | ICD-10-CM | POA: Diagnosis not present

## 2019-02-27 DIAGNOSIS — R040 Epistaxis: Secondary | ICD-10-CM | POA: Diagnosis not present

## 2019-07-18 DIAGNOSIS — R609 Edema, unspecified: Secondary | ICD-10-CM | POA: Diagnosis not present

## 2019-07-18 DIAGNOSIS — E1140 Type 2 diabetes mellitus with diabetic neuropathy, unspecified: Secondary | ICD-10-CM | POA: Diagnosis not present

## 2019-07-18 DIAGNOSIS — I951 Orthostatic hypotension: Secondary | ICD-10-CM | POA: Diagnosis not present

## 2019-07-18 DIAGNOSIS — N1832 Chronic kidney disease, stage 3b: Secondary | ICD-10-CM | POA: Diagnosis not present

## 2019-07-18 DIAGNOSIS — R0602 Shortness of breath: Secondary | ICD-10-CM | POA: Diagnosis not present

## 2019-07-18 DIAGNOSIS — E785 Hyperlipidemia, unspecified: Secondary | ICD-10-CM | POA: Diagnosis not present

## 2019-07-18 DIAGNOSIS — E1122 Type 2 diabetes mellitus with diabetic chronic kidney disease: Secondary | ICD-10-CM | POA: Diagnosis not present

## 2019-07-18 DIAGNOSIS — Z794 Long term (current) use of insulin: Secondary | ICD-10-CM | POA: Diagnosis not present

## 2019-07-18 DIAGNOSIS — R1010 Upper abdominal pain, unspecified: Secondary | ICD-10-CM | POA: Diagnosis not present

## 2019-07-18 DIAGNOSIS — F0390 Unspecified dementia without behavioral disturbance: Secondary | ICD-10-CM | POA: Diagnosis not present

## 2019-07-18 DIAGNOSIS — F418 Other specified anxiety disorders: Secondary | ICD-10-CM | POA: Diagnosis not present

## 2019-07-19 DIAGNOSIS — R0609 Other forms of dyspnea: Secondary | ICD-10-CM | POA: Diagnosis not present

## 2019-07-19 DIAGNOSIS — E1143 Type 2 diabetes mellitus with diabetic autonomic (poly)neuropathy: Secondary | ICD-10-CM | POA: Diagnosis not present

## 2019-07-19 DIAGNOSIS — Z794 Long term (current) use of insulin: Secondary | ICD-10-CM | POA: Diagnosis not present

## 2019-07-19 DIAGNOSIS — E785 Hyperlipidemia, unspecified: Secondary | ICD-10-CM | POA: Diagnosis not present

## 2019-07-19 DIAGNOSIS — R1010 Upper abdominal pain, unspecified: Secondary | ICD-10-CM | POA: Diagnosis not present

## 2019-07-28 DIAGNOSIS — Z9049 Acquired absence of other specified parts of digestive tract: Secondary | ICD-10-CM | POA: Diagnosis not present

## 2019-07-28 DIAGNOSIS — R1011 Right upper quadrant pain: Secondary | ICD-10-CM | POA: Diagnosis not present

## 2019-08-02 DIAGNOSIS — I10 Essential (primary) hypertension: Secondary | ICD-10-CM | POA: Diagnosis not present

## 2019-08-02 DIAGNOSIS — I2510 Atherosclerotic heart disease of native coronary artery without angina pectoris: Secondary | ICD-10-CM | POA: Diagnosis not present

## 2019-08-02 DIAGNOSIS — E785 Hyperlipidemia, unspecified: Secondary | ICD-10-CM | POA: Diagnosis not present

## 2019-08-23 DIAGNOSIS — Z794 Long term (current) use of insulin: Secondary | ICD-10-CM | POA: Diagnosis not present

## 2019-08-23 DIAGNOSIS — R1084 Generalized abdominal pain: Secondary | ICD-10-CM | POA: Diagnosis not present

## 2019-08-23 DIAGNOSIS — R17 Unspecified jaundice: Secondary | ICD-10-CM | POA: Diagnosis not present

## 2019-08-23 DIAGNOSIS — R600 Localized edema: Secondary | ICD-10-CM | POA: Diagnosis not present

## 2019-08-23 DIAGNOSIS — K7469 Other cirrhosis of liver: Secondary | ICD-10-CM | POA: Diagnosis not present

## 2019-08-23 DIAGNOSIS — Z9049 Acquired absence of other specified parts of digestive tract: Secondary | ICD-10-CM | POA: Diagnosis not present

## 2019-08-23 DIAGNOSIS — E119 Type 2 diabetes mellitus without complications: Secondary | ICD-10-CM | POA: Diagnosis not present

## 2019-09-06 DIAGNOSIS — K7469 Other cirrhosis of liver: Secondary | ICD-10-CM | POA: Diagnosis not present

## 2019-09-19 DIAGNOSIS — Z7401 Bed confinement status: Secondary | ICD-10-CM | POA: Diagnosis not present

## 2019-09-19 DIAGNOSIS — E878 Other disorders of electrolyte and fluid balance, not elsewhere classified: Secondary | ICD-10-CM | POA: Diagnosis present

## 2019-09-19 DIAGNOSIS — Z87440 Personal history of urinary (tract) infections: Secondary | ICD-10-CM | POA: Diagnosis not present

## 2019-09-19 DIAGNOSIS — E860 Dehydration: Secondary | ICD-10-CM | POA: Diagnosis present

## 2019-09-19 DIAGNOSIS — I10 Essential (primary) hypertension: Secondary | ICD-10-CM | POA: Diagnosis present

## 2019-09-19 DIAGNOSIS — N390 Urinary tract infection, site not specified: Secondary | ICD-10-CM | POA: Diagnosis present

## 2019-09-19 DIAGNOSIS — Z20822 Contact with and (suspected) exposure to covid-19: Secondary | ICD-10-CM | POA: Diagnosis present

## 2019-09-19 DIAGNOSIS — E119 Type 2 diabetes mellitus without complications: Secondary | ICD-10-CM | POA: Diagnosis present

## 2019-09-19 DIAGNOSIS — E876 Hypokalemia: Secondary | ICD-10-CM | POA: Diagnosis not present

## 2019-09-19 DIAGNOSIS — R4701 Aphasia: Secondary | ICD-10-CM | POA: Diagnosis present

## 2019-09-19 DIAGNOSIS — E785 Hyperlipidemia, unspecified: Secondary | ICD-10-CM | POA: Diagnosis present

## 2019-09-19 DIAGNOSIS — F0390 Unspecified dementia without behavioral disturbance: Secondary | ICD-10-CM | POA: Diagnosis present

## 2019-09-19 DIAGNOSIS — Z794 Long term (current) use of insulin: Secondary | ICD-10-CM | POA: Diagnosis not present

## 2019-09-19 DIAGNOSIS — E875 Hyperkalemia: Secondary | ICD-10-CM | POA: Diagnosis present

## 2019-09-19 DIAGNOSIS — R410 Disorientation, unspecified: Secondary | ICD-10-CM | POA: Diagnosis not present

## 2019-09-19 DIAGNOSIS — R188 Other ascites: Secondary | ICD-10-CM | POA: Diagnosis present

## 2019-09-19 DIAGNOSIS — G9341 Metabolic encephalopathy: Secondary | ICD-10-CM | POA: Diagnosis present

## 2019-09-19 DIAGNOSIS — Z9049 Acquired absence of other specified parts of digestive tract: Secondary | ICD-10-CM | POA: Diagnosis not present

## 2019-09-19 DIAGNOSIS — Z79899 Other long term (current) drug therapy: Secondary | ICD-10-CM | POA: Diagnosis not present

## 2019-09-19 DIAGNOSIS — Z7982 Long term (current) use of aspirin: Secondary | ICD-10-CM | POA: Diagnosis not present

## 2019-09-19 DIAGNOSIS — K766 Portal hypertension: Secondary | ICD-10-CM | POA: Diagnosis present

## 2019-09-19 DIAGNOSIS — K7460 Unspecified cirrhosis of liver: Secondary | ICD-10-CM | POA: Diagnosis present

## 2019-09-28 DIAGNOSIS — N3000 Acute cystitis without hematuria: Secondary | ICD-10-CM | POA: Diagnosis not present

## 2019-09-28 DIAGNOSIS — I10 Essential (primary) hypertension: Secondary | ICD-10-CM | POA: Diagnosis not present

## 2019-09-28 DIAGNOSIS — E1169 Type 2 diabetes mellitus with other specified complication: Secondary | ICD-10-CM | POA: Diagnosis not present

## 2019-09-28 DIAGNOSIS — R413 Other amnesia: Secondary | ICD-10-CM | POA: Diagnosis not present

## 2019-09-28 DIAGNOSIS — E669 Obesity, unspecified: Secondary | ICD-10-CM | POA: Diagnosis not present

## 2019-09-28 DIAGNOSIS — R4182 Altered mental status, unspecified: Secondary | ICD-10-CM | POA: Diagnosis not present

## 2019-10-04 DIAGNOSIS — E1169 Type 2 diabetes mellitus with other specified complication: Secondary | ICD-10-CM | POA: Diagnosis not present

## 2019-10-04 DIAGNOSIS — I10 Essential (primary) hypertension: Secondary | ICD-10-CM | POA: Diagnosis not present

## 2019-10-04 DIAGNOSIS — E669 Obesity, unspecified: Secondary | ICD-10-CM | POA: Diagnosis not present

## 2019-10-04 DIAGNOSIS — R4182 Altered mental status, unspecified: Secondary | ICD-10-CM | POA: Diagnosis not present

## 2019-10-05 DIAGNOSIS — Z7189 Other specified counseling: Secondary | ICD-10-CM | POA: Diagnosis not present

## 2019-10-11 DIAGNOSIS — Z7189 Other specified counseling: Secondary | ICD-10-CM | POA: Diagnosis not present

## 2019-10-14 DIAGNOSIS — R627 Adult failure to thrive: Secondary | ICD-10-CM | POA: Diagnosis not present

## 2019-10-14 DIAGNOSIS — A4151 Sepsis due to Escherichia coli [E. coli]: Secondary | ICD-10-CM | POA: Diagnosis not present

## 2019-10-14 DIAGNOSIS — R4182 Altered mental status, unspecified: Secondary | ICD-10-CM | POA: Diagnosis not present

## 2019-10-14 DIAGNOSIS — R4701 Aphasia: Secondary | ICD-10-CM | POA: Diagnosis not present

## 2019-10-14 DIAGNOSIS — R404 Transient alteration of awareness: Secondary | ICD-10-CM | POA: Diagnosis not present

## 2019-10-14 DIAGNOSIS — R531 Weakness: Secondary | ICD-10-CM | POA: Diagnosis not present

## 2019-10-14 DIAGNOSIS — B9620 Unspecified Escherichia coli [E. coli] as the cause of diseases classified elsewhere: Secondary | ICD-10-CM | POA: Diagnosis not present

## 2019-10-14 DIAGNOSIS — N390 Urinary tract infection, site not specified: Secondary | ICD-10-CM | POA: Diagnosis not present

## 2019-10-14 DIAGNOSIS — R188 Other ascites: Secondary | ICD-10-CM | POA: Diagnosis not present

## 2019-10-14 DIAGNOSIS — F0150 Vascular dementia without behavioral disturbance: Secondary | ICD-10-CM | POA: Diagnosis not present

## 2019-10-15 DIAGNOSIS — A4151 Sepsis due to Escherichia coli [E. coli]: Secondary | ICD-10-CM | POA: Diagnosis not present

## 2019-10-15 DIAGNOSIS — Z9049 Acquired absence of other specified parts of digestive tract: Secondary | ICD-10-CM | POA: Diagnosis not present

## 2019-10-15 DIAGNOSIS — R1312 Dysphagia, oropharyngeal phase: Secondary | ICD-10-CM | POA: Diagnosis present

## 2019-10-15 DIAGNOSIS — R55 Syncope and collapse: Secondary | ICD-10-CM | POA: Diagnosis not present

## 2019-10-15 DIAGNOSIS — Z515 Encounter for palliative care: Secondary | ICD-10-CM | POA: Diagnosis present

## 2019-10-15 DIAGNOSIS — E669 Obesity, unspecified: Secondary | ICD-10-CM | POA: Diagnosis present

## 2019-10-15 DIAGNOSIS — R54 Age-related physical debility: Secondary | ICD-10-CM | POA: Diagnosis not present

## 2019-10-15 DIAGNOSIS — Z7189 Other specified counseling: Secondary | ICD-10-CM | POA: Diagnosis not present

## 2019-10-15 DIAGNOSIS — I10 Essential (primary) hypertension: Secondary | ICD-10-CM | POA: Diagnosis present

## 2019-10-15 DIAGNOSIS — E785 Hyperlipidemia, unspecified: Secondary | ICD-10-CM | POA: Diagnosis present

## 2019-10-15 DIAGNOSIS — F0150 Vascular dementia without behavioral disturbance: Secondary | ICD-10-CM | POA: Diagnosis present

## 2019-10-15 DIAGNOSIS — E119 Type 2 diabetes mellitus without complications: Secondary | ICD-10-CM | POA: Diagnosis not present

## 2019-10-15 DIAGNOSIS — R5381 Other malaise: Secondary | ICD-10-CM | POA: Diagnosis not present

## 2019-10-15 DIAGNOSIS — G309 Alzheimer's disease, unspecified: Secondary | ICD-10-CM | POA: Diagnosis present

## 2019-10-15 DIAGNOSIS — Z6827 Body mass index (BMI) 27.0-27.9, adult: Secondary | ICD-10-CM | POA: Diagnosis not present

## 2019-10-15 DIAGNOSIS — E1165 Type 2 diabetes mellitus with hyperglycemia: Secondary | ICD-10-CM | POA: Diagnosis present

## 2019-10-15 DIAGNOSIS — R41841 Cognitive communication deficit: Secondary | ICD-10-CM | POA: Diagnosis not present

## 2019-10-15 DIAGNOSIS — M1990 Unspecified osteoarthritis, unspecified site: Secondary | ICD-10-CM | POA: Diagnosis present

## 2019-10-15 DIAGNOSIS — F0390 Unspecified dementia without behavioral disturbance: Secondary | ICD-10-CM | POA: Diagnosis not present

## 2019-10-15 DIAGNOSIS — N390 Urinary tract infection, site not specified: Secondary | ICD-10-CM | POA: Diagnosis present

## 2019-10-15 DIAGNOSIS — R627 Adult failure to thrive: Secondary | ICD-10-CM | POA: Diagnosis present

## 2019-10-15 DIAGNOSIS — R4182 Altered mental status, unspecified: Secondary | ICD-10-CM | POA: Diagnosis not present

## 2019-10-15 DIAGNOSIS — R404 Transient alteration of awareness: Secondary | ICD-10-CM | POA: Diagnosis not present

## 2019-10-15 DIAGNOSIS — M6281 Muscle weakness (generalized): Secondary | ICD-10-CM | POA: Diagnosis not present

## 2019-10-15 DIAGNOSIS — B9620 Unspecified Escherichia coli [E. coli] as the cause of diseases classified elsewhere: Secondary | ICD-10-CM | POA: Diagnosis present

## 2019-10-15 DIAGNOSIS — F329 Major depressive disorder, single episode, unspecified: Secondary | ICD-10-CM | POA: Diagnosis not present

## 2019-10-15 DIAGNOSIS — R4701 Aphasia: Secondary | ICD-10-CM | POA: Diagnosis present

## 2019-10-15 DIAGNOSIS — I2510 Atherosclerotic heart disease of native coronary artery without angina pectoris: Secondary | ICD-10-CM | POA: Diagnosis present

## 2019-10-15 DIAGNOSIS — R400 Somnolence: Secondary | ICD-10-CM | POA: Diagnosis not present

## 2019-10-15 DIAGNOSIS — R188 Other ascites: Secondary | ICD-10-CM | POA: Diagnosis present

## 2019-10-15 DIAGNOSIS — B9562 Methicillin resistant Staphylococcus aureus infection as the cause of diseases classified elsewhere: Secondary | ICD-10-CM | POA: Diagnosis not present

## 2019-10-15 DIAGNOSIS — I959 Hypotension, unspecified: Secondary | ICD-10-CM | POA: Diagnosis not present

## 2019-10-15 DIAGNOSIS — Z7982 Long term (current) use of aspirin: Secondary | ICD-10-CM | POA: Diagnosis not present

## 2019-10-15 DIAGNOSIS — E781 Pure hyperglyceridemia: Secondary | ICD-10-CM | POA: Diagnosis present

## 2019-10-15 DIAGNOSIS — Z66 Do not resuscitate: Secondary | ICD-10-CM | POA: Diagnosis present

## 2019-10-15 DIAGNOSIS — B952 Enterococcus as the cause of diseases classified elsewhere: Secondary | ICD-10-CM | POA: Diagnosis present

## 2019-10-15 DIAGNOSIS — E875 Hyperkalemia: Secondary | ICD-10-CM | POA: Diagnosis present

## 2019-10-15 DIAGNOSIS — G9341 Metabolic encephalopathy: Secondary | ICD-10-CM | POA: Diagnosis not present

## 2019-10-15 DIAGNOSIS — R001 Bradycardia, unspecified: Secondary | ICD-10-CM | POA: Diagnosis not present

## 2019-10-15 DIAGNOSIS — E46 Unspecified protein-calorie malnutrition: Secondary | ICD-10-CM | POA: Diagnosis not present

## 2019-10-15 DIAGNOSIS — Z20822 Contact with and (suspected) exposure to covid-19: Secondary | ICD-10-CM | POA: Diagnosis present

## 2019-10-15 DIAGNOSIS — F419 Anxiety disorder, unspecified: Secondary | ICD-10-CM | POA: Diagnosis not present

## 2019-10-20 DIAGNOSIS — Z9289 Personal history of other medical treatment: Secondary | ICD-10-CM | POA: Diagnosis not present

## 2019-10-20 DIAGNOSIS — R531 Weakness: Secondary | ICD-10-CM | POA: Diagnosis not present

## 2019-10-20 DIAGNOSIS — I10 Essential (primary) hypertension: Secondary | ICD-10-CM | POA: Diagnosis not present

## 2019-10-20 DIAGNOSIS — R55 Syncope and collapse: Secondary | ICD-10-CM | POA: Diagnosis not present

## 2019-10-20 DIAGNOSIS — F419 Anxiety disorder, unspecified: Secondary | ICD-10-CM | POA: Diagnosis not present

## 2019-10-20 DIAGNOSIS — F329 Major depressive disorder, single episode, unspecified: Secondary | ICD-10-CM | POA: Diagnosis not present

## 2019-10-20 DIAGNOSIS — Z515 Encounter for palliative care: Secondary | ICD-10-CM | POA: Diagnosis not present

## 2019-10-20 DIAGNOSIS — E46 Unspecified protein-calorie malnutrition: Secondary | ICD-10-CM | POA: Diagnosis not present

## 2019-10-20 DIAGNOSIS — G9341 Metabolic encephalopathy: Secondary | ICD-10-CM | POA: Diagnosis not present

## 2019-10-20 DIAGNOSIS — Z9049 Acquired absence of other specified parts of digestive tract: Secondary | ICD-10-CM | POA: Diagnosis not present

## 2019-10-20 DIAGNOSIS — F0150 Vascular dementia without behavioral disturbance: Secondary | ICD-10-CM | POA: Diagnosis not present

## 2019-10-20 DIAGNOSIS — R1312 Dysphagia, oropharyngeal phase: Secondary | ICD-10-CM | POA: Diagnosis not present

## 2019-10-20 DIAGNOSIS — M1990 Unspecified osteoarthritis, unspecified site: Secondary | ICD-10-CM | POA: Diagnosis not present

## 2019-10-20 DIAGNOSIS — R5383 Other fatigue: Secondary | ICD-10-CM | POA: Diagnosis not present

## 2019-10-20 DIAGNOSIS — E7800 Pure hypercholesterolemia, unspecified: Secondary | ICD-10-CM | POA: Diagnosis not present

## 2019-10-20 DIAGNOSIS — R4701 Aphasia: Secondary | ICD-10-CM | POA: Diagnosis not present

## 2019-10-20 DIAGNOSIS — E669 Obesity, unspecified: Secondary | ICD-10-CM | POA: Diagnosis not present

## 2019-10-20 DIAGNOSIS — R54 Age-related physical debility: Secondary | ICD-10-CM | POA: Diagnosis not present

## 2019-10-20 DIAGNOSIS — N390 Urinary tract infection, site not specified: Secondary | ICD-10-CM | POA: Diagnosis not present

## 2019-10-20 DIAGNOSIS — E43 Unspecified severe protein-calorie malnutrition: Secondary | ICD-10-CM | POA: Diagnosis not present

## 2019-10-20 DIAGNOSIS — E8809 Other disorders of plasma-protein metabolism, not elsewhere classified: Secondary | ICD-10-CM | POA: Diagnosis not present

## 2019-10-20 DIAGNOSIS — R5381 Other malaise: Secondary | ICD-10-CM | POA: Diagnosis not present

## 2019-10-20 DIAGNOSIS — R627 Adult failure to thrive: Secondary | ICD-10-CM | POA: Diagnosis not present

## 2019-10-20 DIAGNOSIS — R41841 Cognitive communication deficit: Secondary | ICD-10-CM | POA: Diagnosis not present

## 2019-10-20 DIAGNOSIS — E785 Hyperlipidemia, unspecified: Secondary | ICD-10-CM | POA: Diagnosis not present

## 2019-10-20 DIAGNOSIS — R1310 Dysphagia, unspecified: Secondary | ICD-10-CM | POA: Diagnosis not present

## 2019-10-20 DIAGNOSIS — E875 Hyperkalemia: Secondary | ICD-10-CM | POA: Diagnosis not present

## 2019-10-20 DIAGNOSIS — F0390 Unspecified dementia without behavioral disturbance: Secondary | ICD-10-CM | POA: Diagnosis not present

## 2019-10-20 DIAGNOSIS — B9562 Methicillin resistant Staphylococcus aureus infection as the cause of diseases classified elsewhere: Secondary | ICD-10-CM | POA: Diagnosis not present

## 2019-10-20 DIAGNOSIS — E119 Type 2 diabetes mellitus without complications: Secondary | ICD-10-CM | POA: Diagnosis not present

## 2019-10-20 DIAGNOSIS — E1169 Type 2 diabetes mellitus with other specified complication: Secondary | ICD-10-CM | POA: Diagnosis not present

## 2019-10-20 DIAGNOSIS — M6281 Muscle weakness (generalized): Secondary | ICD-10-CM | POA: Diagnosis not present

## 2019-10-20 DIAGNOSIS — R001 Bradycardia, unspecified: Secondary | ICD-10-CM | POA: Diagnosis not present

## 2019-10-20 DIAGNOSIS — I959 Hypotension, unspecified: Secondary | ICD-10-CM | POA: Diagnosis not present

## 2019-11-03 DIAGNOSIS — E669 Obesity, unspecified: Secondary | ICD-10-CM | POA: Diagnosis not present

## 2019-11-03 DIAGNOSIS — I10 Essential (primary) hypertension: Secondary | ICD-10-CM | POA: Diagnosis not present

## 2019-11-03 DIAGNOSIS — E1169 Type 2 diabetes mellitus with other specified complication: Secondary | ICD-10-CM | POA: Diagnosis not present

## 2019-11-03 DIAGNOSIS — R5381 Other malaise: Secondary | ICD-10-CM | POA: Diagnosis not present

## 2019-11-03 DIAGNOSIS — E785 Hyperlipidemia, unspecified: Secondary | ICD-10-CM | POA: Diagnosis not present

## 2019-11-03 DIAGNOSIS — Z9049 Acquired absence of other specified parts of digestive tract: Secondary | ICD-10-CM | POA: Diagnosis not present

## 2019-11-03 DIAGNOSIS — Z515 Encounter for palliative care: Secondary | ICD-10-CM | POA: Diagnosis not present

## 2019-11-20 DEATH — deceased
